# Patient Record
Sex: Male | Born: 1965 | Race: Asian | Hispanic: No | Marital: Married | Smoking: Former smoker
Health system: Southern US, Community
[De-identification: ages and names within clinical notes are randomized; demographics above are authoritative.]

## PROBLEM LIST (undated history)

## (undated) DIAGNOSIS — I10 Essential (primary) hypertension: Secondary | ICD-10-CM

## (undated) DIAGNOSIS — E785 Hyperlipidemia, unspecified: Secondary | ICD-10-CM

## (undated) HISTORY — DX: Hyperlipidemia, unspecified: E78.5

## (undated) HISTORY — DX: Essential (primary) hypertension: I10

---

## 1998-11-28 ENCOUNTER — Emergency Department (HOSPITAL_COMMUNITY): Admission: EM | Admit: 1998-11-28 | Discharge: 1998-11-28 | Payer: Self-pay

## 2012-08-11 ENCOUNTER — Ambulatory Visit (INDEPENDENT_AMBULATORY_CARE_PROVIDER_SITE_OTHER): Payer: Managed Care, Other (non HMO) | Admitting: Emergency Medicine

## 2012-08-11 VITALS — BP 143/95 | HR 84 | Temp 99.4°F | Resp 18 | Ht 69.75 in | Wt 175.0 lb

## 2012-08-11 DIAGNOSIS — J020 Streptococcal pharyngitis: Secondary | ICD-10-CM

## 2012-08-11 MED ORDER — AMOXICILLIN 875 MG PO TABS: 875.0000 mg | ORAL_TABLET | Freq: Two times a day (BID) | ORAL | Status: AC

## 2012-08-11 NOTE — Progress Notes (Signed)
  Date:  08/11/2012   Name:  Nathan Kelly   DOB:  11/21/1966   MRN:  [id number] Gender: male Age: 46 y.o.  PCP:  No primary provider on file.    Chief Complaint: Sore Throat and Fever   History of Present Illness:  Nathan Kelly is a 46 y.o. pleasant patient who presents with the following:  Sore throat and fever since last night.  Had chills last night. No coryza or cough, nausea or vomiting, stool change.  No improvement with tylenol so took advil and that improved how he was feeling.  There is no problem list on file for this patient.   No past medical history on file.  No past surgical history on file.  History  Substance Use Topics  . Smoking status: Former Games developer  . Smokeless tobacco: Not on file  . Alcohol Use: Not on file    No family history on file.  No Known Allergies  Medication list has been reviewed and updated.  No current outpatient prescriptions on file prior to visit.    Review of Systems:  As per HPI, otherwise negative.    Physical Examination: Filed Vitals:   08/11/12 1447  BP: 143/95  Pulse: 84  Temp: 99.4 F (37.4 C)  Resp: 18   Filed Vitals:   08/11/12 1447  Height: 5' 9.75" (1.772 m)  Weight: 175 lb (79.379 kg)   Body mass index is 25.29 kg/(m^2). Ideal Body Weight: Weight in (lb) to have BMI = 25: 172.6    GEN: WDWN, NAD, Non-toxic, Alert & Oriented x 3 HEENT: Atraumatic, Normocephalic.  Oropharynx red and injected.  No exudate Ears and Nose: No external deformity. TM negative NECK supple with anterior cervical nodes EXTR: No clubbing/cyanosis/edema NEURO: Normal gait.  PSYCH: Normally interactive. Conversant. Not depressed or anxious appearing.  Calm demeanor.    Assessment and Plan: Strep throat Amoxicillin advil Follow up as needed  Carmelina Dane, MD

## 2012-08-29 ENCOUNTER — Ambulatory Visit (INDEPENDENT_AMBULATORY_CARE_PROVIDER_SITE_OTHER): Payer: Managed Care, Other (non HMO) | Admitting: Emergency Medicine

## 2012-08-29 VITALS — BP 120/68 | HR 56 | Temp 98.0°F | Resp 16 | Ht 70.0 in | Wt 173.6 lb

## 2012-08-29 DIAGNOSIS — Z0000 Encounter for general adult medical examination without abnormal findings: Secondary | ICD-10-CM

## 2012-08-29 LAB — LIPID PANEL
Cholesterol: 176 mg/dL (ref 0–200)
LDL Cholesterol: 102 mg/dL — ABNORMAL HIGH (ref 0–99)
Triglycerides: 156 mg/dL — ABNORMAL HIGH (ref <150)
VLDL: 31 mg/dL (ref 0–40)

## 2012-08-29 LAB — COMPREHENSIVE METABOLIC PANEL
ALT: 34 U/L (ref 0–53)
Albumin: 4.3 g/dL (ref 3.5–5.2)
Alkaline Phosphatase: 77 U/L (ref 39–117)
CO2: 30 mEq/L (ref 19–32)
Glucose, Bld: 91 mg/dL (ref 70–99)
Potassium: 4 mEq/L (ref 3.5–5.3)
Sodium: 140 mEq/L (ref 135–145)
Total Protein: 7.5 g/dL (ref 6.0–8.3)

## 2012-08-29 LAB — POCT CBC
Granulocyte percent: 60.5 %G (ref 37–80)
MID (cbc): 0.4 (ref 0–0.9)
MPV: 9.8 fL (ref 0–99.8)
POC Granulocyte: 4 (ref 2–6.9)
POC MID %: 6.7 %M (ref 0–12)
Platelet Count, POC: 260 10*3/uL (ref 142–424)
RBC: 5.6 M/uL (ref 4.69–6.13)

## 2012-08-29 LAB — POCT UA - MICROSCOPIC ONLY: Yeast, UA: NEGATIVE

## 2012-08-29 LAB — POCT URINALYSIS DIPSTICK
Glucose, UA: NEGATIVE
Nitrite, UA: NEGATIVE
Protein, UA: NEGATIVE
Urobilinogen, UA: 0.2

## 2012-08-29 LAB — IFOBT (OCCULT BLOOD): IFOBT: POSITIVE

## 2012-08-29 NOTE — Progress Notes (Signed)
Date:  08/29/2012   Name:  Nathan Kelly   DOB:  [birth year]   MRN:  [id number] Gender: male Age: 46 y.o.  PCP:  No primary provider on file.    Chief Complaint: Annual Exam   History of Present Illness:  Nathan Kelly is a 46 y.o. pleasant patient who presents with the following:  For wellness exam.  Has no acute complaint.  History of high cholesterol treated with diet and exercise.  Nonsmoker, stopped 4 years ago.  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   No past medical history on file.  No past surgical history on file.  History  Substance Use Topics  . Smoking status: Former Games developer  . Smokeless tobacco: Not on file  . Alcohol Use: Not on file    No family history on file.  No Known Allergies  Medication list has been reviewed and updated.  No outpatient prescriptions prior to visit.    Review of Systems:  As per HPI, otherwise negative.    Physical Examination: Filed Vitals:   08/29/12 0922  BP: 120/68  Pulse: 56  Temp: 98 F (36.7 C)  Resp: 16   Filed Vitals:   08/29/12 0922  Height: 5\' 10"  (1.778 m)  Weight: 173 lb 9.6 oz (78.744 kg)   Body mass index is 24.91 kg/(m^2). Ideal Body Weight: Weight in (lb) to have BMI = 25: 173.9   GEN: WDWN, NAD, Non-toxic, A & O x 3 HEENT: Atraumatic, Normocephalic. Neck supple. No masses, No LAD.  Fundi benign.  Oropharynx negative Ears and Nose: No external deformity. TM negative CV: RRR, No M/G/R. No JVD. No thrill. No extra heart sounds. PULM: CTA B, no wheezes, crackles, rhonchi. No retractions. No resp. distress. No accessory muscle use. ABD: S, NT, ND, +BS. No rebound. No HSM. EXTR: No c/c/e NEURO Normal gait.  PSYCH: Normally interactive. Conversant. Not depressed or anxious appearing.  Calm demeanor.  Genitalia normal male circumcised Rectal normal exam.  Prostate right lobe seems firmer than left.    Assessment and Plan: Wellness exam History of high  cholesterol Labs Follow up as needed  Carmelina Dane, MD I have reviewed and agree with documentation. Robert P. Merla Riches, M.D.

## 2012-08-30 LAB — PSA: PSA: 2.7 ng/mL (ref <=4.00)

## 2012-08-30 LAB — VITAMIN D 25 HYDROXY (VIT D DEFICIENCY, FRACTURES): Vit D, 25-Hydroxy: 26 ng/mL — ABNORMAL LOW (ref 30–89)

## 2012-08-30 LAB — TESTOSTERONE: Testosterone: 427.9 ng/dL (ref 300–890)

## 2012-09-02 ENCOUNTER — Telehealth: Payer: Self-pay | Admitting: Radiology

## 2012-09-02 NOTE — Telephone Encounter (Signed)
Patient called for lab results, can someone review and I will call pt to advise?

## 2012-09-03 ENCOUNTER — Telehealth: Payer: Self-pay

## 2012-09-03 ENCOUNTER — Other Ambulatory Visit: Payer: Self-pay | Admitting: Emergency Medicine

## 2012-09-03 MED ORDER — VITAMIN D (ERGOCALCIFEROL) 1.25 MG (50000 UNIT) PO CAPS: 50000.0000 [IU] | ORAL_CAPSULE | ORAL | Status: DC

## 2012-09-03 NOTE — Telephone Encounter (Signed)
Pt notified. Faxed copy to pt.

## 2012-09-03 NOTE — Telephone Encounter (Signed)
Pt calling re: lab results  [phone]

## 2012-09-03 NOTE — Telephone Encounter (Signed)
Labs are normal except cholesterol slightly elevated. Work on [employer] and exercise. Vitamin D is slightly low - recommend OTC multivitamin.

## 2012-09-03 NOTE — Telephone Encounter (Signed)
Returning call re: lab results.  [phone]

## 2012-09-03 NOTE — Telephone Encounter (Signed)
The patient called again for lab results.  The patient is unhappy with having to wait for results.  Please call patient as soon as possible with results at [phone].

## 2012-09-04 NOTE — Telephone Encounter (Signed)
Called pt LMOM, it looks like we spoke to him already.

## 2013-12-22 ENCOUNTER — Ambulatory Visit (INDEPENDENT_AMBULATORY_CARE_PROVIDER_SITE_OTHER): Payer: No Typology Code available for payment source | Admitting: Family Medicine

## 2013-12-22 ENCOUNTER — Ambulatory Visit: Payer: No Typology Code available for payment source

## 2013-12-22 VITALS — BP 140/98 | HR 69 | Temp 98.0°F | Resp 16 | Ht 69.5 in | Wt 176.0 lb

## 2013-12-22 DIAGNOSIS — R1032 Left lower quadrant pain: Secondary | ICD-10-CM

## 2013-12-22 LAB — COMPREHENSIVE METABOLIC PANEL WITH GFR
ALT: 14 U/L (ref 0–53)
AST: 19 U/L (ref 0–37)
Albumin: 4.3 g/dL (ref 3.5–5.2)
CO2: 30 meq/L (ref 19–32)
Calcium: 8.9 mg/dL (ref 8.4–10.5)
Chloride: 102 meq/L (ref 96–112)
Creat: 0.83 mg/dL (ref 0.50–1.35)
Potassium: 3.9 meq/L (ref 3.5–5.3)

## 2013-12-22 LAB — POCT URINALYSIS DIPSTICK
Bilirubin, UA: NEGATIVE
Blood, UA: NEGATIVE
Glucose, UA: NEGATIVE
Ketones, UA: NEGATIVE
Leukocytes, UA: NEGATIVE
Nitrite, UA: NEGATIVE
Protein, UA: NEGATIVE
Spec Grav, UA: <=1.005
Urobilinogen, UA: 0.2
pH, UA: 6.5

## 2013-12-22 LAB — POCT CBC
Granulocyte percent: 66.1 % (ref 37–80)
HCT, POC: 46.9 % (ref 43.5–53.7)
Hemoglobin: 15.2 g/dL (ref 14.1–18.1)
Lymph, poc: 1.6 (ref 0.6–3.4)
MCH, POC: 28.9 pg (ref 27–31.2)
MCHC: 32.4 g/dL (ref 31.8–35.4)
MCV: 89.1 fL (ref 80–97)
MID (cbc): 0.3 (ref 0–0.9)
MPV: 7.6 fL (ref 0–99.8)
POC Granulocyte: 3.8 (ref 2–6.9)
POC LYMPH PERCENT: 27.9 %L (ref 10–50)
POC MID %: 6 % (ref 0–12)
Platelet Count, POC: 214 10*3/uL (ref 142–424)
RBC: 5.26 M/uL (ref 4.69–6.13)
RDW, POC: 13.3 %
WBC: 5.7 10*3/uL (ref 4.6–10.2)

## 2013-12-22 LAB — COMPREHENSIVE METABOLIC PANEL
Alkaline Phosphatase: 74 U/L (ref 39–117)
BUN: 12 mg/dL (ref 6–23)
Glucose, Bld: 100 mg/dL — ABNORMAL HIGH (ref 70–99)
Sodium: 139 mEq/L (ref 135–145)
Total Bilirubin: 0.8 mg/dL (ref 0.3–1.2)
Total Protein: 7.2 g/dL (ref 6.0–8.3)

## 2013-12-22 LAB — POCT UA - MICROSCOPIC ONLY
Bacteria, U Microscopic: NEGATIVE
Casts, Ur, LPF, POC: NEGATIVE
Crystals, Ur, HPF, POC: NEGATIVE
Epithelial cells, urine per micros: NEGATIVE
Mucus, UA: NEGATIVE
RBC, urine, microscopic: NEGATIVE
Yeast, UA: NEGATIVE

## 2013-12-22 NOTE — Patient Instructions (Signed)
Abdominal Pain, Adult  Many things can cause belly (abdominal) pain. Most times, the belly pain is not dangerous. Many cases of belly pain can be watched and treated at home.  HOME CARE   · Do not take medicines that help you go poop (laxatives) unless told to by your doctor.  · Only take medicine as told by your doctor.  · Eat or drink as told by your doctor. Your doctor will tell you if you should be on a special diet.  GET HELP IF:  · You do not know what is causing your belly pain.  · You have belly pain while you are sick to your stomach (nauseous) or have runny poop (diarrhea).  · You have pain while you pee or poop.  · Your belly pain wakes you up at night.  · You have belly pain that gets worse or better when you eat.  · You have belly pain that gets worse when you eat fatty foods.  GET HELP RIGHT AWAY IF:   · The pain does not go away within 2 hours.  · You have a fever.  · You keep throwing up (vomiting).  · The pain changes and is only in the right or left part of the belly.  · You have bloody or tarry looking poop.  MAKE SURE YOU:   · Understand these instructions.  · Will watch your condition.  · Will get help right away if you are not doing well or get worse.  Document Released: 05/15/2008 Document Revised: 09/17/2013 Document Reviewed: 08/06/2013  ExitCare® Patient Information ©2014 [employer].

## 2013-12-22 NOTE — Progress Notes (Signed)
Chief Complaint:  Chief Complaint  Patient presents with  . Hip Pain    Left side, little below hip, X 2 weeks    HPI: Nathan Kelly is a 48 y.o. male who is here for  LLQ abd pain about 2 weeks ago, may have lifted something wrong but not sure. He has not had fevers or chills, feels more pain after he eats. He has pain when bending over sometimes. He has had no abdominal surgeries. Denies nausea, vomiting.  He denies any pain or pressure when he lifts, has a BM, increase abd pressure or coughs. He does not think he has an inguinal hernia. BM have been normal. Denies constipation, He does lift his daughter who is 13 y/o.   History reviewed. No pertinent past medical history. History reviewed. No pertinent past surgical history. History   Social History  . Marital Status: Married    Spouse Name: N/A    Number of Children: N/A  . Years of Education: N/A   Social History Main Topics  . Smoking status: Former Games developer  . Smokeless tobacco: None  . Alcohol Use: None  . Drug Use: None  . Sexual Activity: None   Other Topics Concern  . None   Social History Narrative  . None   Family History  Problem Relation Age of Onset  . Diabetes Mother   . Hypertension Mother   . Hypertension Daughter    No Known Allergies Prior to Admission medications   Medication Sig Start Date End Date Taking? Authorizing Provider  Vitamin D, Ergocalciferol, (DRISDOL) 50000 UNITS CAPS Take 1 capsule (50,000 Units total) by mouth every 7 (seven) days. 09/03/12  Yes Phillips Odor, MD     ROS: The patient denies fevers, chills, night sweats, unintentional weight loss, chest pain, palpitations, wheezing, dyspnea on exertion, nausea, vomiting,  dysuria, hematuria, melena, numbness, weakness, or tingling.   All other systems have been reviewed and were otherwise negative with the exception of those mentioned in the HPI and as above.    PHYSICAL EXAM: Filed Vitals:   12/22/13 1019  BP: 140/98    Pulse:   Temp:   Resp:    Filed Vitals:   12/22/13 0936  Height: 5' 9.5" (1.765 m)  Weight: 176 lb (79.833 kg)   Body mass index is 25.63 kg/(m^2).  General: Alert, no acute distress HEENT:  Normocephalic, atraumatic, oropharynx patent. EOMI, PERRLA Cardiovascular:  Regular rate and rhythm, no rubs murmurs or gallops.  No Carotid bruits, radial pulse intact. No pedal edema.  Respiratory: Clear to auscultation bilaterally.  No wheezes, rales, or rhonchi.  No cyanosis, no use of accessory musculature GI: No organomegaly, abdomen is soft and non-tender, positive bowel sounds.  No masses. No ventral, abd hernias Skin: No rashes. Neurologic: Facial musculature symmetric. Psychiatric: Patient is appropriate throughout our interaction. Lymphatic: No cervical lymphadenopathy Musculoskeletal: Gait intact.  Back exam -nl, full ROM, 5/5 strength, nontender, no deformities   LABS: Results for orders placed in visit on 12/22/13  POCT CBC      Result Value Range   WBC 5.7  4.6 - 10.2 K/uL   Lymph, poc 1.6  0.6 - 3.4   POC LYMPH PERCENT 27.9  10 - 50 %L   MID (cbc) 0.3  0 - 0.9   POC MID % 6.0  0 - 12 %M   POC Granulocyte 3.8  2 - 6.9   Granulocyte percent 66.1  37 - 80 %G  RBC 5.26  4.69 - 6.13 M/uL   Hemoglobin 15.2  14.1 - 18.1 g/dL   HCT, POC 16.146.9  09.643.5 - 53.7 %   MCV 89.1  80 - 97 fL   MCH, POC 28.9  27 - 31.2 pg   MCHC 32.4  31.8 - 35.4 g/dL   RDW, POC 04.513.3     Platelet Count, POC 214  142 - 424 K/uL   MPV 7.6  0 - 99.8 fL  POCT UA - MICROSCOPIC ONLY      Result Value Range   WBC, Ur, HPF, POC 0-1     RBC, urine, microscopic neg     Bacteria, U Microscopic neg     Mucus, UA neg     Epithelial cells, urine per micros nege     Crystals, Ur, HPF, POC neg     Casts, Ur, LPF, POC neg     Yeast, UA neg    POCT URINALYSIS DIPSTICK      Result Value Range   Color, UA yellow     Clarity, UA clear     Glucose, UA neg     Bilirubin, UA neg     Ketones, UA neg     Spec  Grav, UA <=1.005     Blood, UA neg     pH, UA 6.5     Protein, UA neg     Urobilinogen, UA 0.2     Nitrite, UA neg     Leukocytes, UA Negative       EKG/XRAY:   Primary read [facility]. No free air, no obvious stones, nonspecific, nonobstructive gas patterns    ASSESSMENT/PLAN: Encounter Diagnosis  Name Primary?  . Abdominal pain, left lower quadrant Yes   Possible sprain or strain, ? Etiology . DOes not appear to be hernia in origin. POssible constipation vs sprain/strain from lifting daughter.  Try miralax  For 1 week and monitor for worsenign sxs F/u prn  Gross sideeffects, risk and benefits, and alternatives of medications d/w patient. Patient is aware that all medications have potential sideeffects and we are unable to predict every sideeffect or drug-drug interaction that may occur.  Shunna Mikaelian PHUONG, DO 12/22/2013 10:45 AM

## 2013-12-24 ENCOUNTER — Encounter: Payer: Self-pay | Admitting: Radiology

## 2014-04-07 ENCOUNTER — Ambulatory Visit (INDEPENDENT_AMBULATORY_CARE_PROVIDER_SITE_OTHER): Payer: BC Managed Care – PPO | Admitting: Family Medicine

## 2014-04-07 VITALS — BP 124/78 | HR 74 | Temp 97.4°F | Resp 16 | Ht 69.0 in | Wt 175.4 lb

## 2014-04-07 DIAGNOSIS — Z0000 Encounter for general adult medical examination without abnormal findings: Secondary | ICD-10-CM

## 2014-04-07 DIAGNOSIS — E559 Vitamin D deficiency, unspecified: Secondary | ICD-10-CM

## 2014-04-07 DIAGNOSIS — R195 Other fecal abnormalities: Secondary | ICD-10-CM

## 2014-04-07 DIAGNOSIS — Z23 Encounter for immunization: Secondary | ICD-10-CM

## 2014-04-07 LAB — POCT UA - MICROSCOPIC ONLY
Bacteria, U Microscopic: NEGATIVE
Casts, Ur, LPF, POC: NEGATIVE
Crystals, Ur, HPF, POC: NEGATIVE
Epithelial cells, urine per micros: NEGATIVE
Mucus, UA: NEGATIVE
WBC, Ur, HPF, POC: NEGATIVE
Yeast, UA: NEGATIVE

## 2014-04-07 LAB — POCT URINALYSIS DIPSTICK
Bilirubin, UA: NEGATIVE
Blood, UA: NEGATIVE
Glucose, UA: NEGATIVE
Ketones, UA: NEGATIVE
Leukocytes, UA: NEGATIVE
Nitrite, UA: NEGATIVE
PROTEIN UA: NEGATIVE
Spec Grav, UA: 1.01
UROBILINOGEN UA: 0.2
pH, UA: 6

## 2014-04-07 LAB — COMPREHENSIVE METABOLIC PANEL
ALT: 20 U/L (ref 0–53)
AST: 22 U/L (ref 0–37)
Albumin: 4.4 g/dL (ref 3.5–5.2)
Alkaline Phosphatase: 85 U/L (ref 39–117)
BUN: 10 mg/dL (ref 6–23)
CO2: 30 mEq/L (ref 19–32)
Calcium: 9.3 mg/dL (ref 8.4–10.5)
Chloride: 102 mEq/L (ref 96–112)
Creat: 0.81 mg/dL (ref 0.50–1.35)
Glucose, Bld: 89 mg/dL (ref 70–99)
Potassium: 3.8 mEq/L (ref 3.5–5.3)
Sodium: 138 mEq/L (ref 135–145)
Total Bilirubin: 0.9 mg/dL (ref 0.2–1.2)
Total Protein: 7.5 g/dL (ref 6.0–8.3)

## 2014-04-07 LAB — CBC WITH DIFFERENTIAL/PLATELET
Basophils Absolute: 0 10*3/uL (ref 0.0–0.1)
Basophils Relative: 0 % (ref 0–1)
EOS ABS: 0.2 10*3/uL (ref 0.0–0.7)
Eosinophils Relative: 2 % (ref 0–5)
HCT: 44.3 % (ref 39.0–52.0)
HEMOGLOBIN: 15.4 g/dL (ref 13.0–17.0)
LYMPHS ABS: 2.1 10*3/uL (ref 0.7–4.0)
Lymphocytes Relative: 28 % (ref 12–46)
MCH: 28.8 pg (ref 26.0–34.0)
MCHC: 34.8 g/dL (ref 30.0–36.0)
MCV: 82.8 fL (ref 78.0–100.0)
Monocytes Absolute: 0.6 10*3/uL (ref 0.1–1.0)
Monocytes Relative: 8 % (ref 3–12)
NEUTROS ABS: 4.7 10*3/uL (ref 1.7–7.7)
NEUTROS PCT: 62 % (ref 43–77)
PLATELETS: 215 10*3/uL (ref 150–400)
RBC: 5.35 MIL/uL (ref 4.22–5.81)
RDW: 13.6 % (ref 11.5–15.5)
WBC: 7.6 10*3/uL (ref 4.0–10.5)

## 2014-04-07 LAB — LIPID PANEL
Cholesterol: 194 mg/dL (ref 0–200)
HDL: 45 mg/dL (ref >39)
LDL Cholesterol: 102 mg/dL — ABNORMAL HIGH (ref 0–99)
Total CHOL/HDL Ratio: 4.3 Ratio
Triglycerides: 234 mg/dL — ABNORMAL HIGH (ref <150)
VLDL: 47 mg/dL — ABNORMAL HIGH (ref 0–40)

## 2014-04-07 MED ORDER — TETANUS-DIPHTH-ACELL PERTUSSIS 5-2.5-18.5 LF-MCG/0.5 IM SUSP: 0.5000 mL | Freq: Once | INTRAMUSCULAR | Status: DC

## 2014-04-07 NOTE — Progress Notes (Signed)
Subjective:    Patient ID: Nathan Kelly, male    DOB: [birth year], 48 y.o.   MRN: [id number]  HPI Patient reports today for CPE. He also would like to have a Tdap. He can not recall the last time he was vaccinated for tetanus. He would like to have his blood work checked today. Had low vitamin D level 9/13. Also noted positive IFOBT.   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 He runs for exercise 1-2 x day.   FH- married with 3 daugthers  Sleeps 6 hours a night. Feels tired some mornings. Tries to eat healthy.  Review of Systems  All other systems reviewed and are negative.    Objective:   Physical Exam  Vitals reviewed. Constitutional: He is oriented to person, place, and time. He appears well-developed and well-nourished.  HENT:  Head: Normocephalic and atraumatic.  Right Ear: Tympanic membrane, external ear and ear canal normal.  Left Ear: Tympanic membrane, external ear and ear canal normal.  Nose: Nose normal.  Mouth/Throat: Oropharynx is clear and moist. No oropharyngeal exudate.  Eyes: Conjunctivae are normal. Pupils are equal, round, and reactive to light. Right eye exhibits no discharge. Left eye exhibits no discharge.  Neck: Normal range of motion. Neck supple. No JVD present.  Cardiovascular: Normal rate, regular rhythm, normal heart sounds and intact distal pulses.   Pulmonary/Chest: Effort normal and breath sounds normal.  Abdominal: Soft. Bowel sounds are normal.  Genitourinary: Penis normal. No penile tenderness.  Patient did not want prostate exam today.  Musculoskeletal: Normal range of motion. He exhibits no edema and no tenderness.  Lymphadenopathy:    He has no cervical adenopathy.  Neurological: He is alert and oriented to person, place, and time. He has normal reflexes.  Skin: Skin is warm and dry.    Psychiatric: He has a normal mood and affect. His behavior is normal. Judgment and thought content normal.         Assessment & Plan:  1. Unspecified vitamin D deficiency - Vit D  25 hydroxy (rtn osteoporosis monitoring) - Was low 9/13 and had been taking daily Vit D  2. Heme positive stool -This was from 9/13. Patient to collect home IFOBT sample and return to clinic. If +, will refer to GI for colonoscopy. - CBC with Differential - Comprehensive metabolic panel - IFOBT POC (occult bld, rslt in office); Future   3. Annual physical exam - Lipid panel - POCT UA - Microscopic Only - POCT urinalysis dipstick  4. Need for immunization with diphtheria, tetanus, and poliovirus vaccine - Tdap (BOOSTRIX) injection 0.5 mL; Inject 0.5 mLs into the muscle once.  Encouraged patient to continue to exercise and eat healthy. Encouraged biannual dental visits and annual eye exams.   Emi Belfast, FNP-BC  [facility], [facility]  04/07/2014 1:36 PM

## 2014-04-07 NOTE — Progress Notes (Signed)
I have discussed this case with Ms. Gessner, NP and agree.  

## 2014-04-08 ENCOUNTER — Telehealth: Payer: Self-pay | Admitting: *Deleted

## 2014-04-08 ENCOUNTER — Other Ambulatory Visit: Payer: Self-pay | Admitting: Family Medicine

## 2014-04-08 DIAGNOSIS — R195 Other fecal abnormalities: Secondary | ICD-10-CM

## 2014-04-08 LAB — VITAMIN D 25 HYDROXY (VIT D DEFICIENCY, FRACTURES): Vit D, 25-Hydroxy: 47 ng/mL (ref 30–89)

## 2014-04-08 NOTE — Telephone Encounter (Signed)
Tried to call patient, one number disconnected, there was no answer at the other and no answering machine. Can you verify numbers?

## 2014-04-08 NOTE — Telephone Encounter (Signed)
Pt notified that labs have not been reviewed yet. Can you please review. Thanks

## 2014-04-08 NOTE — Telephone Encounter (Signed)
PT CALLED BACK ABOUT LABS.  HIS NUMBER IS [phone]

## 2014-04-08 NOTE — Telephone Encounter (Signed)
Pt is very anxious for his lab results. Can you review?

## 2014-04-09 NOTE — Telephone Encounter (Signed)
Labs were reviewed with patient on 04/08/14 at 4:28 pm by [place] Reeeborah Gessner.  She is referring him to GI.

## 2014-05-11 ENCOUNTER — Encounter: Payer: Self-pay | Admitting: Family Medicine

## 2014-09-09 ENCOUNTER — Ambulatory Visit (INDEPENDENT_AMBULATORY_CARE_PROVIDER_SITE_OTHER): Payer: BC Managed Care – PPO

## 2014-09-09 ENCOUNTER — Ambulatory Visit (INDEPENDENT_AMBULATORY_CARE_PROVIDER_SITE_OTHER): Payer: BC Managed Care – PPO | Admitting: Family Medicine

## 2014-09-09 VITALS — BP 134/72 | HR 60 | Temp 98.0°F | Resp 18 | Ht 70.0 in | Wt 168.0 lb

## 2014-09-09 DIAGNOSIS — R5381 Other malaise: Secondary | ICD-10-CM

## 2014-09-09 DIAGNOSIS — R5383 Other fatigue: Secondary | ICD-10-CM

## 2014-09-09 DIAGNOSIS — R531 Weakness: Secondary | ICD-10-CM

## 2014-09-09 DIAGNOSIS — R0602 Shortness of breath: Secondary | ICD-10-CM

## 2014-09-09 LAB — POCT CBC
Granulocyte percent: 63.9 %G (ref 37–80)
HCT, POC: 48.8 % (ref 43.5–53.7)
Hemoglobin: 16 g/dL (ref 14.1–18.1)
Lymph, poc: 1.5 (ref 0.6–3.4)
MCH: 28.6 pg (ref 27–31.2)
MCHC: 32.8 g/dL (ref 31.8–35.4)
MCV: 87.2 fL (ref 80–97)
MID (CBC): 0.4 (ref 0–0.9)
MPV: 7.2 fL (ref 0–99.8)
PLATELET COUNT, POC: 191 10*3/uL (ref 142–424)
POC Granulocyte: 3.3 (ref 2–6.9)
POC LYMPH %: 29 % (ref 10–50)
POC MID %: 7.1 % (ref 0–12)
RBC: 5.59 M/uL (ref 4.69–6.13)
RDW, POC: 13 %
WBC: 5.1 10*3/uL (ref 4.6–10.2)

## 2014-09-09 LAB — C-REACTIVE PROTEIN: CRP: <0.5 mg/dL (ref <0.60)

## 2014-09-09 LAB — IFOBT (OCCULT BLOOD): IFOBT: NEGATIVE

## 2014-09-09 LAB — CK: Total CK: 96 U/L (ref 7–232)

## 2014-09-09 LAB — POCT SEDIMENTATION RATE: POCT SED RATE: 11 mm/hr (ref 0–22)

## 2014-09-09 MED ORDER — ALPRAZOLAM 0.25 MG PO TBDP: 0.2500 mg | ORAL_TABLET | Freq: Three times a day (TID) | ORAL | Status: DC | PRN

## 2014-09-09 NOTE — Patient Instructions (Signed)
Panic Attacks Panic attacks are sudden, short-livedsurges of severe anxiety, fear, or discomfort. They may occur for no reason when you are relaxed, when you are anxious, or when you are sleeping. Panic attacks may occur for a number of reasons:   Healthy people occasionally have panic attacks in extreme, life-threatening situations, such as war or natural disasters. Normal anxiety is a protective mechanism of the body that helps us react to danger (fight or flight response).  Panic attacks are often seen with anxiety disorders, such as panic disorder, social anxiety disorder, generalized anxiety disorder, and phobias. Anxiety disorders cause excessive or uncontrollable anxiety. They may interfere with your relationships or other life activities.  Panic attacks are sometimes seen with other mental illnesses, such as depression and posttraumatic stress disorder.  Certain medical conditions, prescription medicines, and drugs of abuse can cause panic attacks. SYMPTOMS  Panic attacks start suddenly, peak within 20 minutes, and are accompanied by four or more of the following symptoms:  Pounding heart or fast heart rate (palpitations).  Sweating.  Trembling or shaking.  Shortness of breath or feeling smothered.  Feeling choked.  Chest pain or discomfort.  Nausea or strange feeling in your stomach.  Dizziness, light-headedness, or feeling like you will faint.  Chills or hot flushes.  Numbness or tingling in your lips or hands and feet.  Feeling that things are not real or feeling that you are not yourself.  Fear of losing control or going crazy.  Fear of dying. Some of these symptoms can mimic serious medical conditions. For example, you may think you are having a heart attack. Although panic attacks can be very scary, they are not life threatening. DIAGNOSIS  Panic attacks are diagnosed through an assessment by your health care provider. Your health care provider will ask  questions about your symptoms, such as where and when they occurred. Your health care provider will also ask about your medical history and use of alcohol and drugs, including prescription medicines. Your health care provider may order blood tests or other studies to rule out a serious medical condition. Your health care provider may refer you to a mental health professional for further evaluation. TREATMENT   Most healthy people who have one or two panic attacks in an extreme, life-threatening situation will not require treatment.  The treatment for panic attacks associated with anxiety disorders or other mental illness typically involves counseling with a mental health professional, medicine, or a combination of both. Your health care provider will help determine what treatment is best for you.  Panic attacks due to physical illness usually go away with treatment of the illness. If prescription medicine is causing panic attacks, talk with your health care provider about stopping the medicine, decreasing the dose, or substituting another medicine.  Panic attacks due to alcohol or drug abuse go away with abstinence. Some adults need professional help in order to stop drinking or using drugs. HOME CARE INSTRUCTIONS   Take all medicines as directed by your health care provider.   Schedule and attend follow-up visits as directed by your health care provider. It is important to keep all your appointments. SEEK MEDICAL CARE IF:  You are not able to take your medicines as prescribed.  Your symptoms do not improve or get worse. SEEK IMMEDIATE MEDICAL CARE IF:   You experience panic attack symptoms that are different than your usual symptoms.  You have serious thoughts about hurting yourself or others.  You are taking medicine for panic attacks and   have a serious side effect. MAKE SURE YOU:  Understand these instructions.  Will watch your condition.  Will get help right away if you are not  doing well or get worse. Document Released: 11/27/2005 Document Revised: 12/02/2013 Document Reviewed: 07/11/2013 ExitCare Patient Information 2015 [employer]. This information is not intended to replace advice given to you by your health care provider. Make sure you discuss any questions you have with your health care provider.  Generalized Anxiety Disorder Generalized anxiety disorder (GAD) is a mental disorder. It interferes with life functions, including relationships, work, and school. GAD is different from normal anxiety, which everyone experiences at some point in their lives in response to specific life events and activities. Normal anxiety actually helps us prepare for and get through these life events and activities. Normal anxiety goes away after the event or activity is over.  GAD causes anxiety that is not necessarily related to specific events or activities. It also causes excess anxiety in proportion to specific events or activities. The anxiety associated with GAD is also difficult to control. GAD can vary from mild to severe. People with severe GAD can have intense waves of anxiety with physical symptoms (panic attacks).  SYMPTOMS The anxiety and worry associated with GAD are difficult to control. This anxiety and worry are related to many life events and activities and also occur more days than not for 6 months or longer. People with GAD also have three or more of the following symptoms (one or more in children):  Restlessness.   Fatigue.  Difficulty concentrating.   Irritability.  Muscle tension.  Difficulty sleeping or unsatisfying sleep. DIAGNOSIS GAD is diagnosed through an assessment by your health care provider. Your health care provider will ask you questions aboutyour mood,physical symptoms, and events in your life. Your health care provider may ask you about your medical history and use of alcohol or drugs, including prescription medicines. Your health care  provider may also do a physical exam and blood tests. Certain medical conditions and the use of certain substances can cause symptoms similar to those associated with GAD. Your health care provider may refer you to a mental health specialist for further evaluation. TREATMENT The following therapies are usually used to treat GAD:   Medication. Antidepressant medication usually is prescribed for long-term daily control. Antianxiety medicines may be added in severe cases, especially when panic attacks occur.   Talk therapy (psychotherapy). Certain types of talk therapy can be helpful in treating GAD by providing support, education, and guidance. A form of talk therapy called cognitive behavioral therapy can teach you healthy ways to think about and react to daily life events and activities.  Stress managementtechniques. These include yoga, meditation, and exercise and can be very helpful when they are practiced regularly. A mental health specialist can help determine which treatment is best for you. Some people see improvement with one therapy. However, other people require a combination of therapies. Document Released: 03/24/2013 Document Revised: 04/13/2014 Document Reviewed: 03/24/2013 ExitCare Patient Information 2015 [employer]. This information is not intended to replace advice given to you by your health care provider. Make sure you discuss any questions you have with your health care provider.  

## 2014-09-09 NOTE — Progress Notes (Addendum)
Subjective:  This chart was scribed for Norberto Sorenson, MD by Charline Bills, ED Scribe. The patient was seen in room 3. Patient's care was started at 9:12 AM.   Patient ID: Nathan Kelly, male    DOB: Apr 21, 1966, 48 y.o.   MRN: [id number]  Chief Complaint  Patient presents with  . Shortness of Breath    started this morning at 4am   . Generalized Body Aches    all over x2 weeks more like weakness    HPI HPI Comments: Nathan Kelly is a 48 y.o. male who presents to the [facility] complaining of SOB onset 5 hours ago. Pt states that he usually sleeps with a door open but shut it last night. Pt states that SOB lasted for 10 minutes. Pt reports associated generalized arthralgias over the past 2 weeks, worse last night and joint weakness. Pt reports intermittent weakness specifically in shoulders, elbows, knees, ankles, hips, toes, fingers that last for 15-30 minutes. Pt is still able to perform normal activities during weakness. Pt states that he checked his BP at home with a reading of 140/80. He denies pain with lifting. He also denies wheezing, cough, chest pain, palpitations, leg swelling, congestion, dizziness, light-headedness, appetite change. No recent car trips or flights. No h/o blood clot.   Pt was seen in 2013 for blood in stool. He scheduled an appointment with a GI specialist but missed the appointment.   Pt quit smoking 5 years ago. He smoked .5 ppd since middle school.   He has been very stressed lately.  Is building a new house.  Sxs feel like anxiety or like when you wake from a nightmare and your heart is racing.  Past Medical History  Diagnosis Date  . Hyperlipidemia    Current Outpatient Prescriptions on File Prior to Visit  Medication Sig Dispense Refill  . Vitamin D, Ergocalciferol, (DRISDOL) 50000 UNITS CAPS Take 1 capsule (50,000 Units total) by mouth every 7 (seven) days.  12 capsule  3   Current Facility-Administered Medications on File Prior to Visit    Medication Dose Route Frequency Provider Last Rate Last Dose  . Tdap (BOOSTRIX) injection 0.5 mL  0.5 mL Intramuscular Once Emi Belfast, FNP       No Known Allergies  Review of Systems  Constitutional: Negative for appetite change.  HENT: Negative for congestion.   Respiratory: Positive for shortness of breath. Negative for cough and wheezing.   Cardiovascular: Negative for chest pain, palpitations and leg swelling.  Musculoskeletal: Positive for arthralgias.  Neurological: Positive for weakness. Negative for dizziness and light-headedness.   Triage Vitals: BP 134/72  Pulse 60  Temp(Src) 98 F (36.7 C) (Oral)  Resp 18  Ht 5\' 10"  (1.778 m)  Wt 168 lb (76.204 kg)  BMI 24.11 kg/m2  SpO2 100%    Objective:   Physical Exam  Nursing note and vitals reviewed. Constitutional: He is oriented to person, place, and time. He appears well-developed and well-nourished.  HENT:  Head: Normocephalic and atraumatic.  Right Ear: Tympanic membrane is retracted.  Left Ear: Tympanic membrane is retracted.  Mouth/Throat: Oropharynx is clear and moist and mucous membranes are normal. No oropharyngeal exudate, posterior oropharyngeal edema or posterior oropharyngeal erythema.  Eyes: Conjunctivae and EOM are normal.  Neck: Neck supple. No mass and no thyromegaly present.  Cardiovascular: Normal rate, regular rhythm and normal heart sounds.   No murmur heard. Pulses:      Radial pulses are 2+ on the  right side, and 2+ on the left side.  Pulmonary/Chest: Effort normal and breath sounds normal.  Genitourinary: Rectum normal and prostate normal. Rectal exam shows no mass.  Normal prostate Normal anal vault No blood   Musculoskeletal: Normal range of motion.  Lymphadenopathy:    He has no cervical adenopathy.       Right: No supraclavicular adenopathy present.       Left: No supraclavicular adenopathy present.  Neurological: He is alert and oriented to person, place, and time.  Skin: Skin  is warm and dry.  Psychiatric: He has a normal mood and affect. His behavior is normal.  Predicted Peak Flow: 630; Actual Peak Flow: 800 EKG: normal sinus rhythm without ischemic changes   [facility] reading (PRIMARY) by  Dr. Clelia Croft.  CXR: no acute abnormality Orthostatics neg  EXAM: CHEST 2 VIEW  COMPARISON: None.  FINDINGS: Lateral view degraded by patient arm position. Moderate lower thoracic spondylosis. Midline trachea. Normal heart size and mediastinal contours. No pleural effusion or pneumothorax. A nodular density projects over the left mid lung on the frontal radiograph. 9 mm. Clear right lung.  IMPRESSION: Nodular density projecting over the left mid lung on the frontal radiograph. This could represent a nipple shadow. Repeat frontal radiograph with nipple markers could confirm. These results will be called to the ordering clinician or representative by the Radiologist Assistant, and communication documented in the PACS or zVision Dashboard.     Assessment & Plan:   Shortness of breath - Plan: C-reactive protein, CK, Vit D  25 hydroxy (rtn osteoporosis monitoring), POCT CBC, POCT SEDIMENTATION RATE, IFOBT POC (occult bld, rslt in office), DG Chest 2 View, EKG 12-Lead, Peak flow meter  Weakness - Plan: C-reactive protein, CK, Vit D  25 hydroxy (rtn osteoporosis monitoring), POCT CBC, POCT SEDIMENTATION RATE, IFOBT POC (occult bld, rslt in office), DG Chest 2 View, EKG 12-Lead Pt suspects that his sxs may be caused by stress/anxiety - we will do a therapeutic trial.  If it does help and he continues to have sxs, rec RTC to discuss various ways to treat anxiety. He will cont to monitor BP outside the office w/ home cuff - bring to f/u to check against ours for accuracy Meds ordered this encounter  Medications  . OVER THE COUNTER MEDICATION    Sig: bioflex qd  . ALPRAZolam (NIRAVAM) 0.25 MG dissolvable tablet    Sig: Take 1 tablet (0.25 mg total) by mouth 3 (three) times  daily as needed for anxiety (and panic, shortness of breath or weakness).    Dispense:  30 tablet    Refill:  0    I personally performed the services described in this documentation, which was scribed in my presence. The recorded information has been reviewed and considered, and addended by me as needed.  Norberto Sorenson, MD MPH

## 2014-09-10 LAB — VITAMIN D 25 HYDROXY (VIT D DEFICIENCY, FRACTURES): VIT D 25 HYDROXY: 43 ng/mL (ref 30–89)

## 2014-09-14 ENCOUNTER — Telehealth: Payer: Self-pay | Admitting: *Deleted

## 2014-09-14 NOTE — Telephone Encounter (Signed)
Can you please review labs from 09/09/14

## 2014-09-14 NOTE — Telephone Encounter (Signed)
Labs normal but chest xray likely had an artifact but should be repeated - please have pt come in for repeat CXR. Note sent to MyChart w/ labs.

## 2014-09-14 NOTE — Telephone Encounter (Signed)
Spoke to pt- transferred to schedule an appt. Pt requested an appt.

## 2014-09-15 ENCOUNTER — Telehealth: Payer: Self-pay

## 2014-09-15 NOTE — Telephone Encounter (Signed)
Patient called regarding lab results. Please return call. CB # [phone]

## 2014-09-15 NOTE — Telephone Encounter (Signed)
See labs. Let pt know what Dr. Clelia CroftShaw says.

## 2014-09-21 ENCOUNTER — Ambulatory Visit: Payer: BC Managed Care – PPO | Admitting: Family Medicine

## 2014-09-28 ENCOUNTER — Ambulatory Visit: Payer: BC Managed Care – PPO | Admitting: Family Medicine

## 2014-09-29 ENCOUNTER — Ambulatory Visit: Payer: BC Managed Care – PPO | Admitting: Family Medicine

## 2015-07-26 ENCOUNTER — Ambulatory Visit (INDEPENDENT_AMBULATORY_CARE_PROVIDER_SITE_OTHER): Payer: 59 | Admitting: Family Medicine

## 2015-07-26 VITALS — BP 116/72 | HR 68 | Temp 97.9°F | Resp 18 | Ht 70.0 in | Wt 177.0 lb

## 2015-07-26 DIAGNOSIS — M7540 Impingement syndrome of unspecified shoulder: Secondary | ICD-10-CM

## 2015-07-26 DIAGNOSIS — M75100 Unspecified rotator cuff tear or rupture of unspecified shoulder, not specified as traumatic: Secondary | ICD-10-CM

## 2015-07-26 DIAGNOSIS — M25519 Pain in unspecified shoulder: Secondary | ICD-10-CM

## 2015-07-26 DIAGNOSIS — Z130 Encounter for screening for diseases of the blood and blood-forming organs and certain disorders involving the immune mechanism: Secondary | ICD-10-CM | POA: Diagnosis not present

## 2015-07-26 DIAGNOSIS — E559 Vitamin D deficiency, unspecified: Secondary | ICD-10-CM | POA: Diagnosis not present

## 2015-07-26 DIAGNOSIS — Z13220 Encounter for screening for lipoid disorders: Secondary | ICD-10-CM | POA: Diagnosis not present

## 2015-07-26 LAB — COMPLETE METABOLIC PANEL WITH GFR
ALT: 18 U/L (ref 9–46)
CO2: 28 mmol/L (ref 20–31)
Chloride: 100 mmol/L (ref 98–110)
GFR, Est African American: >89 mL/min (ref >=60)
GFR, Est Non African American: >89 mL/min (ref >=60)
Glucose, Bld: 97 mg/dL (ref 65–99)
Sodium: 140 mmol/L (ref 135–146)

## 2015-07-26 LAB — COMPLETE METABOLIC PANEL WITHOUT GFR
AST: 22 U/L (ref 10–40)
Albumin: 4.2 g/dL (ref 3.6–5.1)
Alkaline Phosphatase: 76 U/L (ref 40–115)
BUN: 12 mg/dL (ref 7–25)
Calcium: 9 mg/dL (ref 8.6–10.3)
Creat: 0.86 mg/dL (ref 0.60–1.35)
Potassium: 4.4 mmol/L (ref 3.5–5.3)
Total Bilirubin: 0.8 mg/dL (ref 0.2–1.2)
Total Protein: 7.7 g/dL (ref 6.1–8.1)

## 2015-07-26 LAB — CBC
HCT: 47.5 % (ref 39.0–52.0)
Hemoglobin: 16.1 g/dL (ref 13.0–17.0)
MCH: 28.4 pg (ref 26.0–34.0)
MCHC: 33.9 g/dL (ref 30.0–36.0)
MCV: 83.8 fL (ref 78.0–100.0)
MPV: 9.5 fL (ref 8.6–12.4)
Platelets: 212 10*3/uL (ref 150–400)
RBC: 5.67 MIL/uL (ref 4.22–5.81)
RDW: 13.7 % (ref 11.5–15.5)
WBC: 7.3 10*3/uL (ref 4.0–10.5)

## 2015-07-26 LAB — LIPID PANEL
Cholesterol: 184 mg/dL (ref 125–200)
HDL: 46 mg/dL (ref >=40)
LDL Cholesterol: 109 mg/dL (ref <130)
Total CHOL/HDL Ratio: 4 ratio (ref <=5.0)
Triglycerides: 147 mg/dL (ref <150)
VLDL: 29 mg/dL (ref <30)

## 2015-07-26 NOTE — Progress Notes (Signed)
 Chief Complaint:  Chief Complaint  Patient presents with  . Shoulder Pain    Both, onset 2 weeks  . Lipid panel    HPI: Nathan Kelly is a 49 y.o. male who reports to [facility] today complaining of bilateral 2/10 constant and sometimes sharp  achng painin shoudlers, worse when he sleeps on it, NKI, he denies any w/n/t. He has to stretch and shake it off. He has not tried anything for this. 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   Wants labs for hyperlipdiemia, did not eat today.  He is doing well otherwise.   Past Medical History  Diagnosis Date  . Hyperlipidemia    History reviewed. No pertinent past surgical history. Social History   Social History  . Marital Status: Married    Spouse Name: N/A  . Number of Children: N/A  . Years of Education: N/A   Social History Main Topics  . Smoking status: Former Games developer  . Smokeless tobacco: None  . Alcohol Use: None  . Drug Use: None  . Sexual Activity: Not Asked   Other Topics Concern  . None   Social History Narrative   Family History  Problem Relation Age of Onset  . Diabetes Mother   . Hypertension Mother   . Hypertension Daughter    No Known Allergies Prior to Admission medications   Medication Sig Start Date End Date Taking? Authorizing Provider  ALPRAZolam (NIRAVAM) 0.25 MG dissolvable tablet Take 1 tablet (0.25 mg total) by mouth 3 (three) times daily as needed for anxiety (and panic, shortness of breath or weakness). Patient not taking: Reported on 07/26/2015 09/09/14   Sherren Mocha, MD  OVER THE COUNTER MEDICATION bioflex qd    Historical Provider, MD  Vitamin D, Ergocalciferol, (DRISDOL) 50000 UNITS CAPS Take 1 capsule (50,000 Units total) by mouth every 7 (seven) days. Patient not taking: Reported on 07/26/2015 09/03/12   Carmelina Dane, MD     ROS: The patient denies fevers, chills, night sweats, unintentional weight loss, chest pain, palpitations,  wheezing, dyspnea on exertion, nausea, vomiting, abdominal pain, dysuria, hematuria, melena, numbness, weakness, or tingling.   All other systems have been reviewed and were otherwise negative with the exception of those mentioned in the HPI and as above.    PHYSICAL EXAM: Filed Vitals:   07/26/15 1040  BP: 116/72  Pulse: 68  Temp: 97.9 F (36.6 C)  Resp: 18   Body mass index is 25.4 kg/(m^2).   General: Alert, no acute distress HEENT:  Normocephalic, atraumatic, oropharynx patent. EOMI, PERRLA Cardiovascular:  Regular rate and rhythm, no rubs murmurs or gallops.  No Carotid bruits, radial pulse intact. No pedal edema.  Respiratory: Clear to auscultation bilaterally.  No wheezes, rales, or rhonchi.  No cyanosis, no use of accessory musculature Abdominal: No organomegaly, abdomen is soft and non-tender, positive bowel sounds. No masses. Skin: No rashes. Neurologic: Facial musculature symmetric. Psychiatric: Patient acts appropriately throughout our interaction. Lymphatic: No cervical or submandibular lymphadenopathy Musculoskeletal: Gait intact. No edema, tenderness Neck exam normal-neg spurling Shoulder No deformity, no hypertrophy/atrophy, no erythema, no fluid, no wounds Full ROM Nontender at [facility] jt Neg Empty Can test, neg Lift off test, neg Speeds, Neg Hawkins/Neers 5/5 strength, 2/2 triceps and biceps DTRs       LABS: Results for orders placed or performed in visit on 09/09/14  C-reactive protein  Result Value Ref Range  CRP <0.5 <0.60 mg/dL  CK  Result Value Ref Range   Total CK 96 7 - 232 U/L  Vit D  25 hydroxy (rtn osteoporosis monitoring)  Result Value Ref Range   Vit D, 25-Hydroxy 43 30 - 89 ng/mL  POCT CBC  Result Value Ref Range   WBC 5.1 4.6 - 10.2 K/uL   Lymph, poc 1.5 0.6 - 3.4   POC LYMPH PERCENT 29.0 10 - 50 %L   MID (cbc) 0.4 0 - 0.9   POC MID % 7.1 0 - 12 %M   POC Granulocyte 3.3 2 - 6.9   Granulocyte percent 63.9 37 - 80 %G   RBC 5.59 4.69  - 6.13 M/uL   Hemoglobin 16.0 14.1 - 18.1 g/dL   HCT, POC 16.1 09.6 - 53.7 %   MCV 87.2 80 - 97 fL   MCH, POC 28.6 27 - 31.2 pg   MCHC 32.8 31.8 - 35.4 g/dL   RDW, POC 04.5 %   Platelet Count, POC 191 142 - 424 K/uL   MPV 7.2 0 - 99.8 fL  POCT SEDIMENTATION RATE  Result Value Ref Range   POCT SED RATE 11 0 - 22 mm/hr  IFOBT POC (occult bld, rslt in office)  Result Value Ref Range   IFOBT Negative      EKG/XRAY:   Primary read interpreted by Dr. Conley Rolls at [facility].   ASSESSMENT/PLAN: Encounter Diagnoses  Name Primary?  . Pain in joint, shoulder region, unspecified laterality Yes  . Screening for deficiency anemia   . Screening for hyperlipidemia   . Vitamin D deficiency   . Rotator cuff impingement syndrome, unspecified laterality    Shoulder exercises otc tylenol/ibuprofen prn IF no improvement send to PT Declines any other meds which is fine considering he has releif with ROM exercises Labs for Vit D def and hyperlipemia pending Fu prn    Gross sideeffects, risk and benefits, and alternatives of medications d/w patient. Patient is aware that all medications have potential sideeffects and we are unable to predict every sideeffect or drug-drug interaction that may occur.    DO  07/26/2015 12:27 PM

## 2015-07-26 NOTE — Patient Instructions (Signed)

## 2015-07-27 LAB — VITAMIN D 25 HYDROXY (VIT D DEFICIENCY, FRACTURES): Vit D, 25-Hydroxy: 49 ng/mL (ref 30–100)

## 2015-07-27 LAB — TSH: TSH: 0.628 u[IU]/mL (ref 0.350–4.500)

## 2015-09-25 IMAGING — CR DG ABDOMEN 1V
1 series · 1 of 1 positions shown · non-contrast
Comparison: None.

CLINICAL DATA: Left lower quadrant abdominal pain

EXAM:
ABDOMEN - 1 VIEW

[AP]
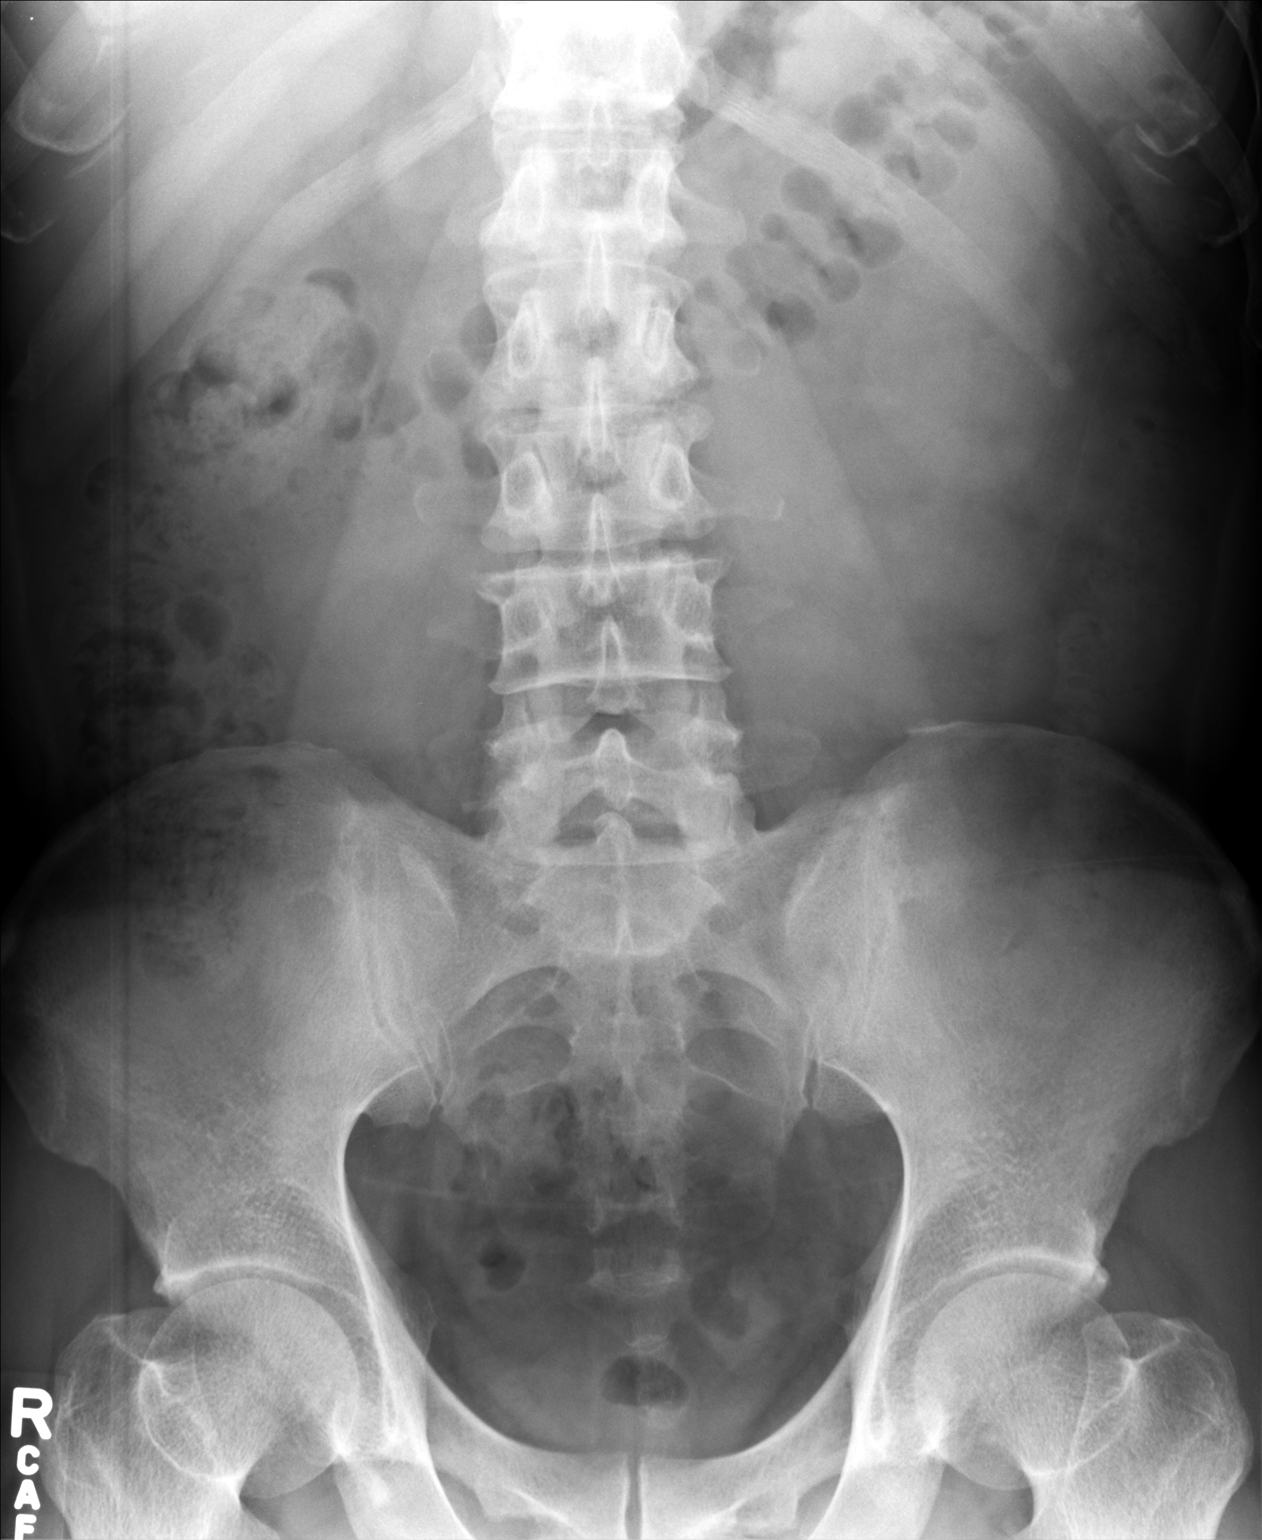

[1 of 1 positions shown; findings below may reference images not displayed]

FINDINGS: The bowel gas pattern is normal. No radio-opaque calculi or other
significant radiographic abnormality are seen.
IMPRESSION: Negative.

## 2015-12-22 ENCOUNTER — Telehealth: Payer: Self-pay

## 2015-12-22 DIAGNOSIS — R109 Unspecified abdominal pain: Secondary | ICD-10-CM

## 2015-12-22 NOTE — Telephone Encounter (Signed)
I went ahead and referred.

## 2015-12-22 NOTE — Telephone Encounter (Signed)
Can we refer without OV?

## 2015-12-22 NOTE — Telephone Encounter (Signed)
Patient requests a referral to a gastroenterologist.  He had a referral in 2015 for  GI, but he never scheduled with them due to fears of having a colonoscopy.  He said he would like to go and discuss other methods of diagnosis/treatment.  His previous referral was listed for "Heme positive stool."  A new office visit may be needed if this diagnosis is to be utilized again.  He said his abdominal pains are located "at his waistline" and they "come and go" throughout the day.  Please advise, thank you.  CB#: [phone]

## 2015-12-24 ENCOUNTER — Encounter: Payer: Self-pay | Admitting: Gastroenterology

## 2015-12-29 ENCOUNTER — Encounter: Payer: Self-pay | Admitting: Gastroenterology

## 2015-12-29 ENCOUNTER — Ambulatory Visit (INDEPENDENT_AMBULATORY_CARE_PROVIDER_SITE_OTHER): Payer: BLUE CROSS/BLUE SHIELD | Admitting: Gastroenterology

## 2015-12-29 VITALS — BP 124/80 | HR 76 | Ht 69.29 in | Wt 181.1 lb

## 2015-12-29 DIAGNOSIS — R1032 Left lower quadrant pain: Secondary | ICD-10-CM | POA: Insufficient documentation

## 2015-12-29 DIAGNOSIS — Z1211 Encounter for screening for malignant neoplasm of colon: Secondary | ICD-10-CM

## 2015-12-29 NOTE — Patient Instructions (Signed)
We have sent your demographic and insurance information to [facility]. They should contact you within the next week regarding your Cologuard (colon cancer screening) test. If you have not heard from them within the next week, please call our office at [phone]. 

## 2015-12-29 NOTE — Progress Notes (Signed)
Reviewed and agree with management plan.  Malcolm T. Stark, MD FACG 

## 2015-12-29 NOTE — Progress Notes (Signed)
12/29/2015 Nathan Kelly [id number] 01/16/1966   HISTORY OF PRESENT ILLNESS:  This is a 50 year old Asian male who is new to our practice. He is here today to discuss colonoscopy as well as complaints of left lower quadrant abdominal pain. He tells me that he moves his bowels well and does not ever seeing any blood in his stool. He has been experiencing intermittent left lower quadrant abdominal pain a couple of times per week for approximately the past year. The pain only occurs at nighttime and once he moves to different position the pain resolves, therefore, only lasting 5 or 10 minutes at a time.  He does say that seems to occur on nights when he tends to eat very late. Recent CBC, CMP, TSH were within normal limits within the past 6 months.  He would like to try to get screening for colon cancer, but would prefer Cologuard rather than colonoscopy at this point; he is willing to have colonoscopy if Cologuard is positive.   Past Medical History  Diagnosis Date  . Hyperlipidemia    History reviewed. No pertinent past surgical history.  reports that he has quit smoking. He has never used smokeless tobacco. He reports that he does not drink alcohol or use illicit drugs. family history includes Diabetes in his mother; Hypertension in his daughter and mother. No Known Allergies    Outpatient Encounter Prescriptions as of 12/29/2015  Medication Sig  . OVER THE COUNTER MEDICATION bioflex qd   Facility-Administered Encounter Medications as of 12/29/2015  Medication  . Tdap (BOOSTRIX) injection 0.5 mL     REVIEW OF SYSTEMS  : All other systems reviewed and negative except where noted in the History of Present Illness.   PHYSICAL EXAM: BP 124/80 mmHg  Pulse 76  Ht 5' 9.29" (1.76 m)  Wt 181 lb 2 oz (82.158 kg)  BMI 26.52 kg/m2 General: Well developed Asian male in no acute distress Head: Normocephalic and atraumatic Eyes:  Sclerae anicteric, conjunctiva pink. Ears: Normal auditory  acuity Lungs: Clear throughout to auscultation Heart: Regular rate and rhythm Abdomen: Soft, non-distended.  Normal bowel sounds.  Minimal LLQ/left groin TTP without R/R/G. Musculoskeletal: Symmetrical with no gross deformities  Skin: No lesions on visible extremities Extremities: No edema  Neurological: Alert oriented x 4, grossly non-focal Psychological:  Alert and cooperative. Normal mood and affect  ASSESSMENT AND PLAN: -Screening for colon malignancy:  Patient sent here to discuss colonoscopy.  Patient declines colonoscopy, but would like to have a Cologuard if possible. -LLQ abdominal pain:  Intermittent, comes a couple of times per week and only lasts several minutes before resolving.  ? Musculoskeletal or inguinal hernia.  Does not necessarily sound GI in origin and has no other associated GI complaints.  If pain persists could consider CT scan.  CC:  Dorna Leitz, PA-C

## 2016-01-03 ENCOUNTER — Telehealth: Payer: Self-pay | Admitting: Gastroenterology

## 2016-01-03 NOTE — Telephone Encounter (Signed)
Left message with ICD codes on patients voicemail.

## 2016-05-17 ENCOUNTER — Telehealth: Payer: Self-pay | Admitting: Gastroenterology

## 2016-05-17 NOTE — Telephone Encounter (Signed)
Left a message for patient to call back. Located results of Cologuard which are negative.

## 2016-05-18 NOTE — Telephone Encounter (Signed)
Left a message for patient to call back. 

## 2016-05-19 ENCOUNTER — Other Ambulatory Visit: Payer: Self-pay

## 2016-05-19 LAB — COLOGUARD: COLOGUARD: NEGATIVE

## 2016-05-19 NOTE — Telephone Encounter (Signed)
Left a message for patient to call back. 

## 2016-05-22 NOTE — Telephone Encounter (Signed)
Patient given result(negative) and fax a copy of result to patient at [phone]. Do you want a 3 year recall for patient?

## 2016-05-23 NOTE — Telephone Encounter (Signed)
Yes please

## 2016-05-23 NOTE — Telephone Encounter (Signed)
Recall in EPIC 

## 2016-06-12 IMAGING — CR DG CHEST 2V
2 series · 2 of 2 positions shown · non-contrast
Comparison: None.

CLINICAL DATA: Short of breath and weakness.

EXAM:
CHEST  2 VIEW

[PA]
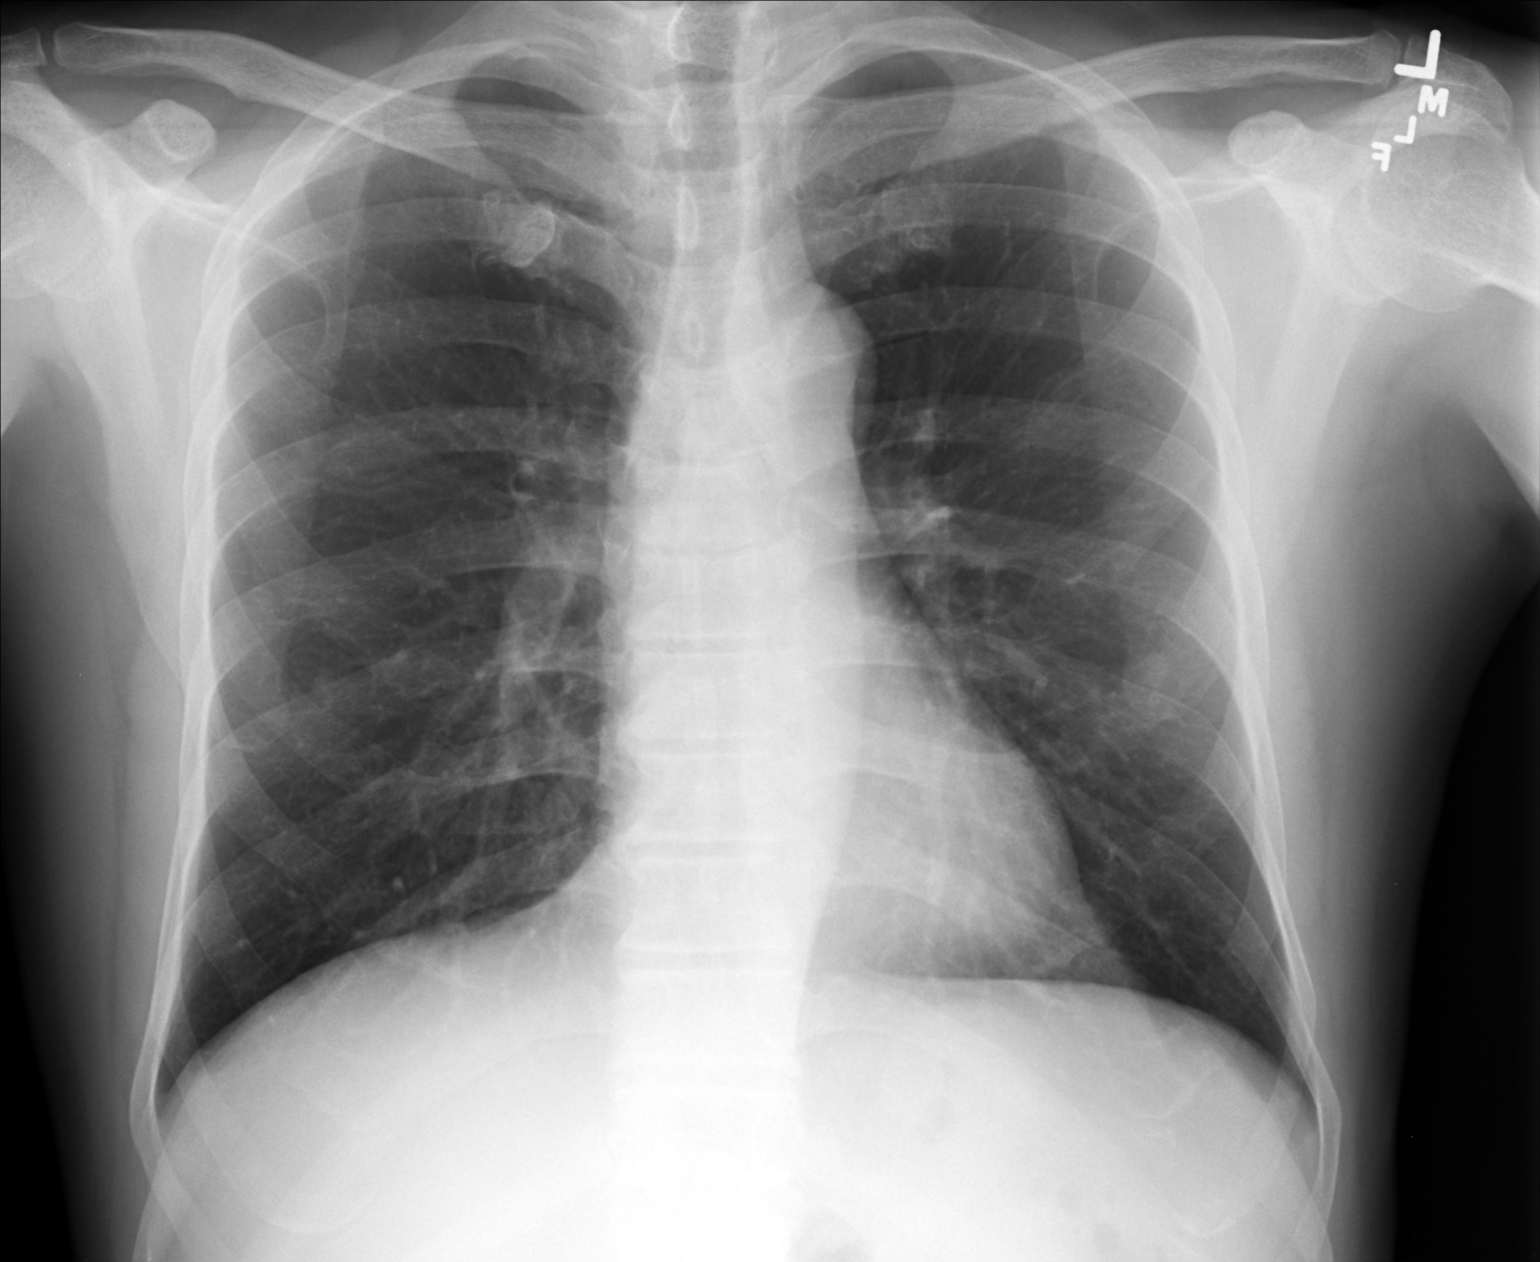

[lateral]
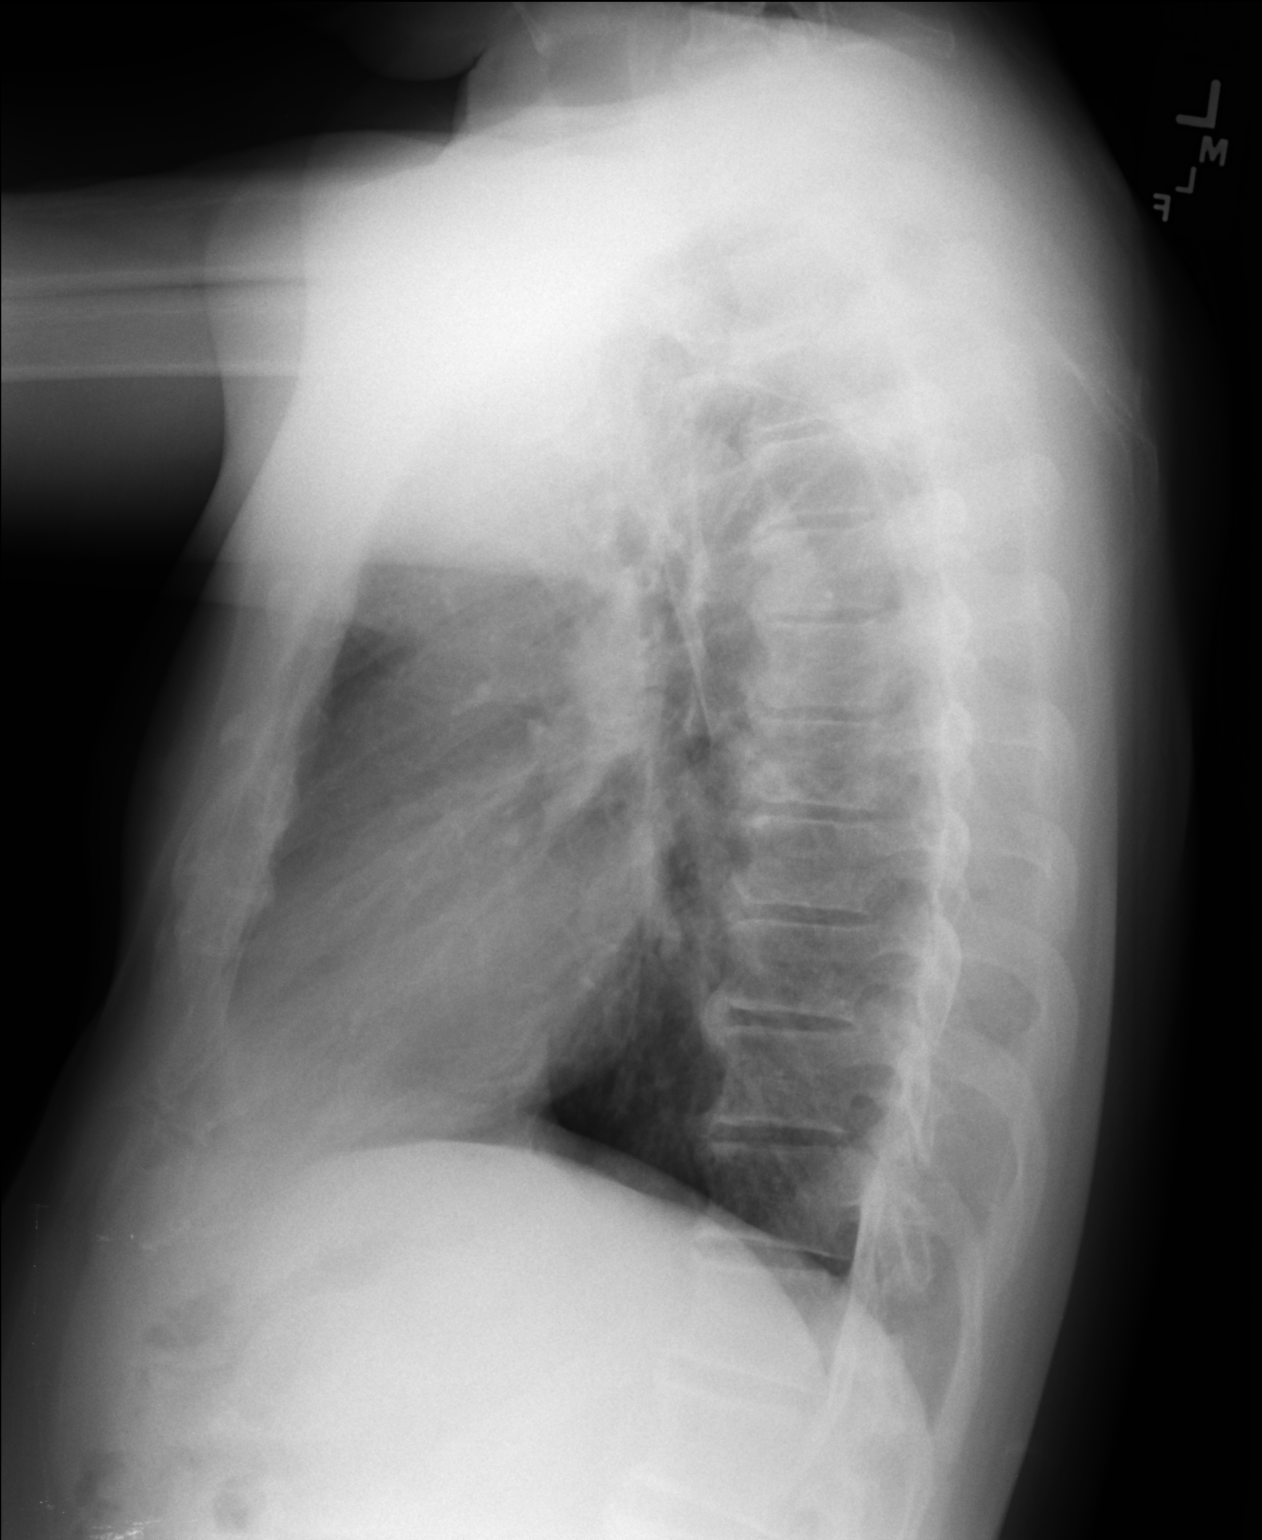

[2 of 2 positions shown; findings below may reference images not displayed]

FINDINGS: Lateral view degraded by patient arm position. Moderate lower
thoracic spondylosis. Midline trachea. Normal heart size and
mediastinal contours. No pleural effusion or pneumothorax. A nodular
density projects over the left mid lung on the frontal radiograph. 9
mm. Clear right lung.
IMPRESSION: Nodular density projecting over the left mid lung on the frontal
radiograph. This could represent a nipple shadow. Repeat frontal
radiograph with nipple markers could confirm. These results will be
called to the ordering clinician or representative by the
Radiologist Assistant, and communication documented in the PACS or
zVision Dashboard.

## 2017-02-28 ENCOUNTER — Ambulatory Visit (INDEPENDENT_AMBULATORY_CARE_PROVIDER_SITE_OTHER): Payer: BLUE CROSS/BLUE SHIELD | Admitting: Physician Assistant

## 2017-02-28 VITALS — BP 120/80 | HR 71 | Temp 98.0°F | Resp 17 | Ht 69.29 in | Wt 179.0 lb

## 2017-02-28 DIAGNOSIS — L298 Other pruritus: Secondary | ICD-10-CM | POA: Diagnosis not present

## 2017-02-28 DIAGNOSIS — Z23 Encounter for immunization: Secondary | ICD-10-CM | POA: Diagnosis not present

## 2017-02-28 MED ORDER — ZOSTER VAC RECOMB ADJUVANTED 50 MCG/0.5ML IM SUSR: 50.0000 ug | Freq: Once | INTRAMUSCULAR | 0 refills | Status: AC

## 2017-02-28 MED ORDER — PREDNISONE 20 MG PO TABS: ORAL_TABLET | ORAL | 0 refills | Status: AC

## 2017-02-28 NOTE — Patient Instructions (Signed)
     IF you received an x-ray today, you will receive an invoice from [facility]. Please contact  Radiology at [phone] with questions or concerns regarding your invoice.   IF you received labwork today, you will receive an invoice from [facility]. Please contact [facility] at [phone] with questions or concerns regarding your invoice.   Our billing staff will not be able to assist you with questions regarding bills from these companies.  You will be contacted with the lab results as soon as they are available. The fastest way to get your results is to activate your My Chart account. Instructions are located on the last page of this paperwork. If you have not heard from us regarding the results in 2 weeks, please contact this office.     

## 2017-02-28 NOTE — Progress Notes (Signed)
02/28/2017 2:57 PM   DOB: May 25, 1966 / MRN: [id number]  SUBJECTIVE:  Nathan Kelly is a 51 y.o.  male with no significant previous medical history presenting for rash that started after eating fish about 5 days ago. He complains of severe itching, which occurs mostly at night.  Tells me he was seen at a local medical office and was given a shot of prednisone and the rash immediately went away. He is sexually with his wife 20 years and he is monogamous.    He has No Known Allergies.   He  has a past medical history of Hyperlipidemia.    He  reports that he has quit smoking. He has never used smokeless tobacco. He reports that he does not drink alcohol or use drugs. He  has no sexual activity history on file. The patient  has no past surgical history on file.  His family history includes Diabetes in his mother; Hypertension in his daughter and mother.  Review of Systems  Constitutional: Negative for chills and fever.  Gastrointestinal: Negative for nausea.  Skin: Positive for itching and rash.  Neurological: Negative for dizziness.    The problem list and medications were reviewed and updated by myself where necessary and exist elsewhere in the encounter.   OBJECTIVE:  BP 120/80 (BP Location: Right Arm, Patient Position: Sitting, Cuff Size: Normal)   Pulse 71   Temp 98 F (36.7 C) (Oral)   Resp 17   Ht 5' 9.29" (1.76 m)   Wt 179 lb (81.2 kg)   SpO2 100%   BMI 26.21 kg/m   Physical Exam  Constitutional: He is oriented to person, place, and time.  HENT:  Mouth/Throat: Oropharynx is clear and moist. No oropharyngeal exudate.  Cardiovascular: Normal rate and regular rhythm.   Pulmonary/Chest: Effort normal.  Neurological: He is alert and oriented to person, place, and time.           No results found for this or any previous visit (from the past 72 hour(s)).  No results found.  ASSESSMENT AND PLAN:  [place] was seen today for rash.  Diagnoses and all orders for this  visit:  Pruritic erythematous rash: He was better on pred near the start of the rash.  He may not have had enough time to clear the antigen.  Will try a longer course of pred.  If this fails he will come back and will rule out HIV and start a rheum work up.  -     CMP14+EGFR -     predniSONE (DELTASONE) 20 MG tablet; Take 3 in the morning for 3 days, then 2 in the morning for 3 days, and then 1 in the morning for 3 days.  Need for shingles vaccine: He will take this 2 weeks after stopping pred.  -     Varicella-zoster vaccine IM (Shingrix)    The patient is advised to call or return to clinic if he does not see an improvement in symptoms, or to seek the care of the closest emergency department if he worsens with the above plan.   Deliah Boston, MHS, PA-C [facility] 02/28/2017 2:57 PM

## 2017-03-01 LAB — CMP14+EGFR
ALBUMIN: 4.3 g/dL (ref 3.5–5.5)
ALK PHOS: 83 IU/L (ref 39–117)
ALT: 15 IU/L (ref 0–44)
AST: 19 IU/L (ref 0–40)
Albumin/Globulin Ratio: 1.4 (ref 1.2–2.2)
BUN / CREAT RATIO: 16 (ref 9–20)
BUN: 14 mg/dL (ref 6–24)
Bilirubin Total: 0.5 mg/dL (ref 0.0–1.2)
CO2: 27 mmol/L (ref 18–29)
CREATININE: 0.9 mg/dL (ref 0.76–1.27)
Calcium: 8.9 mg/dL (ref 8.7–10.2)
Chloride: 98 mmol/L (ref 96–106)
GFR calc Af Amer: 115 mL/min/{1.73_m2} (ref >59)
GFR calc non Af Amer: 99 mL/min/{1.73_m2} (ref >59)
GLUCOSE: 91 mg/dL (ref 65–99)
Globulin, Total: 3.1 g/dL (ref 1.5–4.5)
Potassium: 3.6 mmol/L (ref 3.5–5.2)
Sodium: 141 mmol/L (ref 134–144)
Total Protein: 7.4 g/dL (ref 6.0–8.5)

## 2017-03-02 ENCOUNTER — Telehealth: Payer: Self-pay | Admitting: Family Medicine

## 2017-03-02 NOTE — Telephone Encounter (Signed)
Normal labs.

## 2017-03-02 NOTE — Telephone Encounter (Signed)
Pt calling about labs °

## 2017-03-02 NOTE — Telephone Encounter (Signed)
Correct

## 2017-03-05 ENCOUNTER — Telehealth: Payer: Self-pay | Admitting: General Practice

## 2017-03-05 ENCOUNTER — Telehealth: Payer: Self-pay | Admitting: Family Medicine

## 2017-03-05 NOTE — Telephone Encounter (Signed)
[phone] faxed

## 2017-03-05 NOTE — Telephone Encounter (Signed)
Pt is calling to ask Deliah BostonMichael Clark which dermatologist do he suggest his rash is no better and still itch he wants to make his own referral please call pt

## 2017-03-05 NOTE — Telephone Encounter (Signed)
Pt is looking for lab results from Thursday   Best number [phone]

## 2017-03-05 NOTE — Telephone Encounter (Signed)
Yes. Labs perfect thus far.

## 2017-03-05 NOTE — Telephone Encounter (Signed)
Normal result given 

## 2017-03-05 NOTE — Telephone Encounter (Signed)
I want to see him back in the clinic to see how the rash has progressed.   There is more work to do to ensure that this is actually a skin problem and not from something else.  If he insist on a referral then I would suggest [facility]. Deliah BostonMichael Broghan Pannone, MS, PA-C 9:24 PM, 03/05/2017

## 2017-03-07 ENCOUNTER — Ambulatory Visit (INDEPENDENT_AMBULATORY_CARE_PROVIDER_SITE_OTHER): Payer: BLUE CROSS/BLUE SHIELD | Admitting: Physician Assistant

## 2017-03-07 VITALS — BP 147/90 | HR 58 | Temp 97.6°F | Resp 16 | Ht 69.29 in | Wt 177.8 lb

## 2017-03-07 DIAGNOSIS — Z114 Encounter for screening for human immunodeficiency virus [HIV]: Secondary | ICD-10-CM | POA: Diagnosis not present

## 2017-03-07 DIAGNOSIS — Z1329 Encounter for screening for other suspected endocrine disorder: Secondary | ICD-10-CM | POA: Diagnosis not present

## 2017-03-07 DIAGNOSIS — Z13220 Encounter for screening for lipoid disorders: Secondary | ICD-10-CM

## 2017-03-07 DIAGNOSIS — R21 Rash and other nonspecific skin eruption: Secondary | ICD-10-CM

## 2017-03-07 DIAGNOSIS — Z130 Encounter for screening for diseases of the blood and blood-forming organs and certain disorders involving the immune mechanism: Secondary | ICD-10-CM

## 2017-03-07 DIAGNOSIS — Z0000 Encounter for general adult medical examination without abnormal findings: Secondary | ICD-10-CM

## 2017-03-07 DIAGNOSIS — Z113 Encounter for screening for infections with a predominantly sexual mode of transmission: Secondary | ICD-10-CM

## 2017-03-07 MED ORDER — TRIAMCINOLONE ACETONIDE 0.5 % EX CREA: 1.0000 "application " | TOPICAL_CREAM | Freq: Two times a day (BID) | CUTANEOUS | 0 refills | Status: DC

## 2017-03-07 NOTE — Patient Instructions (Addendum)
We did lab work and gave you a cream for your rash.    IF you received an x-ray today, you will receive an invoice from [facility]. Please contact [facility] at [phone] with questions or concerns regarding your invoice.   IF you received labwork today, you will receive an invoice from [facility]. Please contact [facility] at [phone] with questions or concerns regarding your invoice.   Our billing staff will not be able to assist you with questions regarding bills from these companies.  You will be contacted with the lab results as soon as they are available. The fastest way to get your results is to activate your My Chart account. Instructions are located on the last page of this paperwork. If you have not heard from us regarding the results in 2 weeks, please contact this office.    Keeping you healthy  Get these tests  Blood pressure- Have your blood pressure checked once a year by your healthcare provider.  Normal blood pressure is 120/80  Weight- Have your body mass index (BMI) calculated to screen for obesity.  BMI is a measure of body fat based on height and weight. You can also calculate your own BMI at [email or web address]  Cholesterol- Have your cholesterol checked every year.  Diabetes- Have your blood sugar checked regularly if you have high blood pressure, high cholesterol, have a family history of diabetes or if you are overweight.  Screening for Colon Cancer- Colonoscopy starting at age 51.  Screening may begin sooner depending on your family history and other health conditions. Follow up colonoscopy as directed by your Gastroenterologist.  Screening for Prostate Cancer- Both blood work (PSA) and a rectal exam help screen for Prostate Cancer.  Screening begins at age 51 with African-American men and at age 51 with Caucasian men.  Screening may begin sooner depending on your family history.  Take these medicines  Aspirin- One aspirin daily  can help prevent Heart disease and Stroke.  Flu shot- Every fall.  Tetanus- Every 10 years.  Zostavax- Once after the age of 51 to prevent Shingles.  Pneumonia shot- Once after the age of 51; if you are younger than 3765, ask your healthcare provider if you need a Pneumonia shot.  Take these steps  Don't smoke- If you do smoke, talk to your doctor about quitting.  For tips on how to quit, go to www.smokefree.gov or call 1-800-QUIT-NOW.  Be physically active- Exercise 5 days a week for at least 30 minutes.  If you are not already physically active start slow and gradually work up to 30 minutes of moderate physical activity.  Examples of moderate activity include walking briskly, mowing the yard, dancing, swimming, bicycling, etc.  Eat a healthy diet- Eat a variety of healthy food such as fruits, vegetables, low fat milk, low fat cheese, yogurt, lean meant, poultry, fish, beans, tofu, etc. For more information go to www.thenutritionsource.org  Drink alcohol in moderation- Limit alcohol intake to less than two drinks a day. Never drink and drive.  Dentist- Brush and floss twice daily; visit your dentist twice a year.  Depression- Your emotional health is as important as your physical health. If you're feeling down, or losing interest in things you would normally enjoy please talk to your healthcare provider.  Eye exam- Visit your eye doctor every year.  Safe sex- If you may be exposed to a sexually transmitted infection, use a condom.  Seat belts- Seat belts can save your life; always wear one.  Smoke/Carbon  Monoxide detectors- These detectors need to be installed on the appropriate level of your home.  Replace batteries at least once a year.  Skin cancer- When out in the sun, cover up and use sunscreen 15 SPF or higher.  Violence- If anyone is threatening you, please tell your healthcare provider.  Living Will/ Health care power of attorney- Speak with your healthcare provider and  family.

## 2017-03-07 NOTE — Progress Notes (Signed)
Nathan Kelly  MRN: [id number] DOB: 07-13-1966  PCP: No PCP Per Patient  Subjective:  Pt presents to clinic for a CPE.  Rash - for about 23 days - he has had an injection of steroid and then oral steroids but the rash is clearing up but still very itchy and waking him up at night.  He has also taken atarax which makes him sleepy and zyrtec but neither helped with the itching.  He thinks that this started after he ate fish.  Wife does not have any type of rash.  He is worried because she was treated for an STD at the [facility] with a pill but he is not sure what it was but he was never treated.  Now he is concerned and would like full STD screening.   Last dental exam: every 6 months Last vision exam: 3 months ago  Last colonoscopy: cologuard - neg 2017 Vaccinations      Tetanus - UTD  Typical meals for patient: 3 meals no snacks Typical beverage choices: green tea and water, starbucks (1-2/week) Exercises: 3 times per week for 30 minutes Sleeps: up all night 5-6 hrs per night  Patient Active Problem List   Diagnosis Date Noted  . Special screening for malignant neoplasms, colon 12/29/2015  . LLQ abdominal pain 12/29/2015    Review of Systems  Constitutional: Negative.   HENT: Negative.   Eyes: Negative.   Respiratory: Negative.   Cardiovascular: Negative.   Gastrointestinal: Negative.   Endocrine: Negative.   Genitourinary: Negative.   Musculoskeletal: Negative.   Skin: Positive for rash.  Allergic/Immunologic: Negative.   Neurological: Negative.   Hematological: Negative.   Psychiatric/Behavioral: Negative.      Current Outpatient Prescriptions on File Prior to Visit  Medication Sig Dispense Refill  . predniSONE (DELTASONE) 20 MG tablet Take 3 in the morning for 3 days, then 2 in the morning for 3 days, and then 1 in the morning for 3 days. 18 tablet 0   No current facility-administered medications on file prior to visit.     No Known Allergies  Social History    Social History  . Marital status: Married    Spouse name: [name]  . Number of children: 3  . Years of education: N/A   Occupational History  . Technical brewer   Social History Main Topics  . Smoking status: Former Smoker    Packs/day: 0.50    Years: 30.00    Quit date: 12/11/2008  . Smokeless tobacco: Never Used  . Alcohol use No  . Drug use: No  . Sexual activity: Yes    Partners: Female    Birth control/ protection: Surgical   Other Topics Concern  . None   Social History Narrative   Moved to [place] in 1989 from [place]   Lives with wife and 3 daughters      Wears seatbelt 100%   Guns in home - yes secured    No past surgical history on file.  Family History  Problem Relation Age of Onset  . Diabetes Mother   . Hypertension Mother   . Hypertension Daughter      Objective:  BP (!) 147/90   Pulse (!) 58   Temp 97.6 F (36.4 C) (Oral)   Resp 16   Ht 5' 9.29" (1.76 m)   Wt 177 lb 12.8 oz (80.6 kg)   SpO2 100%   BMI 26.04 kg/m   Physical Exam  Constitutional: He is oriented to person, place, and time and well-developed, well-nourished, and in no distress.  HENT:  Head: Normocephalic and atraumatic.  Right Ear: Hearing, tympanic membrane, external ear and ear canal normal.  Left Ear: Hearing, tympanic membrane, external ear and ear canal normal.  Nose: Nose normal.  Mouth/Throat: Uvula is midline, oropharynx is clear and moist and mucous membranes are normal.  Eyes: Conjunctivae and EOM are normal. Pupils are equal, round, and reactive to light.  Neck: Trachea normal and normal range of motion. Neck supple. No thyroid mass and no thyromegaly present.  Cardiovascular: Normal rate, regular rhythm and normal heart sounds.   No murmur heard. Pulmonary/Chest: Effort normal and breath sounds normal.  Abdominal: Soft. Bowel sounds are normal. Hernia confirmed negative in the right inguinal area and confirmed negative in the left inguinal  area.  Genitourinary: Testes/scrotum normal and penis normal.  Musculoskeletal: Normal range of motion.  Neurological: He is alert and oriented to person, place, and time. Gait normal.  Skin: Skin is warm and dry.  Rash appears to be improved -- still present on arms and legs and worse on the ankles - mild erythema but mostly hyperpigmentation and excoriations -- no harold patch seen on torso  Psychiatric: Mood, memory, affect and judgment normal.    Visual Acuity Screening   Right eye Left eye Both eyes  Without correction: 20/20 20/20 -1 20/15 -1  With correction:       Assessment and Plan :  Annual physical exam  Screening for deficiency anemia - Plan: CBC with Differential/Platelet  Screening, lipid - Plan: Lipid panel  Rash - Plan: RPR, Dermatology pathology, triamcinolone cream (KENALOG) 0.5 % -- unsure cause - check additional labs including RPR - ? Possible pityriasis rosea due to length of time and extra itchy without any help with IM or oral steroids.  Will give the patient topical at his request while we are waiting on his biopsy results  Screening for HIV (human immunodeficiency virus) - Plan: HIV antibody  Screen for STD (sexually transmitted disease) - Plan: GC/Chlamydia Probe Amp, Trichomonas vaginalis, RNA  Screening for thyroid disorder - Plan: TSH   Benny Lennert PA-C  Primary Care at [facility] 03/07/2017 1:48 PM

## 2017-03-08 LAB — CBC WITH DIFFERENTIAL/PLATELET
BASOS ABS: 0 10*3/uL (ref 0.0–0.2)
BASOS: 0 %
EOS (ABSOLUTE): 0.1 10*3/uL (ref 0.0–0.4)
Eos: 1 %
Hematocrit: 44.7 % (ref 37.5–51.0)
Hemoglobin: 15.3 g/dL (ref 13.0–17.7)
Immature Grans (Abs): 0 10*3/uL (ref 0.0–0.1)
Immature Granulocytes: 1 %
LYMPHS ABS: 2 10*3/uL (ref 0.7–3.1)
Lymphs: 24 %
MCH: 28.2 pg (ref 26.6–33.0)
MCHC: 34.2 g/dL (ref 31.5–35.7)
MCV: 83 fL (ref 79–97)
MONOS ABS: 0.7 10*3/uL (ref 0.1–0.9)
Monocytes: 8 %
NEUTROS ABS: 5.7 10*3/uL (ref 1.4–7.0)
Neutrophils: 66 %
Platelets: 234 10*3/uL (ref 150–379)
RBC: 5.42 x10E6/uL (ref 4.14–5.80)
RDW: 14.1 % (ref 12.3–15.4)
WBC: 8.5 10*3/uL (ref 3.4–10.8)

## 2017-03-08 LAB — GC/CHLAMYDIA PROBE AMP
CHLAMYDIA, DNA PROBE: NEGATIVE
NEISSERIA GONORRHOEAE BY PCR: NEGATIVE

## 2017-03-08 LAB — LIPID PANEL
Chol/HDL Ratio: 3 ratio units (ref 0.0–5.0)
Cholesterol, Total: 204 mg/dL — ABNORMAL HIGH (ref 100–199)
HDL: 69 mg/dL (ref >39)
LDL Calculated: 99 mg/dL (ref 0–99)
Triglycerides: 181 mg/dL — ABNORMAL HIGH (ref 0–149)
VLDL CHOLESTEROL CAL: 36 mg/dL (ref 5–40)

## 2017-03-08 LAB — TSH: TSH: 1.3 u[IU]/mL (ref 0.450–4.500)

## 2017-03-08 LAB — RPR: RPR: NONREACTIVE

## 2017-03-08 LAB — HIV ANTIBODY (ROUTINE TESTING W REFLEX): HIV SCREEN 4TH GENERATION: NONREACTIVE

## 2017-03-08 NOTE — Telephone Encounter (Signed)
l/m with clarks note  

## 2017-03-09 ENCOUNTER — Telehealth: Payer: Self-pay | Admitting: Physician Assistant

## 2017-03-09 LAB — TRICHOMONAS VAGINALIS, PROBE AMP: Trich vag by NAA: NEGATIVE

## 2017-03-09 NOTE — Telephone Encounter (Signed)
PATIENT HAD HIS ANNUAL PHYSICAL DONE WITH SARAH ON Wednesday (03/07/17). HE IS CALLING TO GET THE LAB RESULTS. BEST PHONE [phone] (CELL) PHARMACY CHOICE IS [facility] ON [address] MBC

## 2017-03-09 NOTE — Telephone Encounter (Signed)
All labs look nl except lipid and pathology not back?

## 2018-03-12 ENCOUNTER — Ambulatory Visit (INDEPENDENT_AMBULATORY_CARE_PROVIDER_SITE_OTHER): Payer: BLUE CROSS/BLUE SHIELD | Admitting: Family Medicine

## 2018-03-12 ENCOUNTER — Encounter: Payer: Self-pay | Admitting: Family Medicine

## 2018-03-12 ENCOUNTER — Other Ambulatory Visit: Payer: Self-pay

## 2018-03-12 VITALS — BP 128/82 | HR 76 | Temp 97.8°F | Ht 69.0 in | Wt 176.0 lb

## 2018-03-12 DIAGNOSIS — Z0000 Encounter for general adult medical examination without abnormal findings: Secondary | ICD-10-CM | POA: Diagnosis not present

## 2018-03-12 DIAGNOSIS — Z130 Encounter for screening for diseases of the blood and blood-forming organs and certain disorders involving the immune mechanism: Secondary | ICD-10-CM | POA: Diagnosis not present

## 2018-03-12 DIAGNOSIS — E785 Hyperlipidemia, unspecified: Secondary | ICD-10-CM | POA: Diagnosis not present

## 2018-03-12 DIAGNOSIS — Z125 Encounter for screening for malignant neoplasm of prostate: Secondary | ICD-10-CM

## 2018-03-12 DIAGNOSIS — Z1329 Encounter for screening for other suspected endocrine disorder: Secondary | ICD-10-CM

## 2018-03-12 NOTE — Patient Instructions (Addendum)
   IF you received an x-ray today, you will receive an invoice from [facility]. Please contact [facility] at [phone] with questions or concerns regarding your invoice.   IF you received labwork today, you will receive an invoice from [facility]. Please contact [facility] at [phone] with questions or concerns regarding your invoice.   Our billing staff will not be able to assist you with questions regarding bills from these companies.  You will be contacted with the lab results as soon as they are available. The fastest way to get your results is to activate your My Chart account. Instructions are located on the last page of this paperwork. If you have not heard from us regarding the results in 2 weeks, please contact this office.     Preventive Care 40-64 Years, Male Preventive care refers to lifestyle choices and visits with your health care provider that can promote health and wellness. What does preventive care include?  A yearly physical exam. This is also called an annual well check.  Dental exams once or twice a year.  Routine eye exams. Ask your health care provider how often you should have your eyes checked.  Personal lifestyle choices, including: ? Daily care of your teeth and gums. ? Regular physical activity. ? Eating a healthy diet. ? Avoiding tobacco and drug use. ? Limiting alcohol use. ? Practicing safe sex. ? Taking low-dose aspirin every day starting at age 50. What happens during an annual well check? The services and screenings done by your health care provider during your annual well check will depend on your age, overall health, lifestyle risk factors, and family history of disease. Counseling Your health care provider may ask you questions about your:  Alcohol use.  Tobacco use.  Drug use.  Emotional well-being.  Home and relationship well-being.  Sexual activity.  Eating habits.  Work and work  environment.  Screening You may have the following tests or measurements:  Height, weight, and BMI.  Blood pressure.  Lipid and cholesterol levels. These may be checked every 5 years, or more frequently if you are over 50 years old.  Skin check.  Lung cancer screening. You may have this screening every year starting at age 55 if you have a 30-pack-year history of smoking and currently smoke or have quit within the past 15 years.  Fecal occult blood test (FOBT) of the stool. You may have this test every year starting at age 50.  Flexible sigmoidoscopy or colonoscopy. You may have a sigmoidoscopy every 5 years or a colonoscopy every 10 years starting at age 50.  Prostate cancer screening. Recommendations will vary depending on your family history and other risks.  Hepatitis C blood test.  Hepatitis B blood test.  Sexually transmitted disease (STD) testing.  Diabetes screening. This is done by checking your blood sugar (glucose) after you have not eaten for a while (fasting). You may have this done every 1-3 years.  Discuss your test results, treatment options, and if necessary, the need for more tests with your health care provider. Vaccines Your health care provider may recommend certain vaccines, such as:  Influenza vaccine. This is recommended every year.  Tetanus, diphtheria, and acellular pertussis (Tdap, Td) vaccine. You may need a Td booster every 10 years.  Varicella vaccine. You may need this if you have not been vaccinated.  Zoster vaccine. You may need this after age 60.  Measles, mumps, and rubella (MMR) vaccine. You may need at least one dose of   MMR if you were born in 1957 or later. You may also need a second dose.  Pneumococcal 13-valent conjugate (PCV13) vaccine. You may need this if you have certain conditions and have not been vaccinated.  Pneumococcal polysaccharide (PPSV23) vaccine. You may need one or two doses if you smoke cigarettes or if you have  certain conditions.  Meningococcal vaccine. You may need this if you have certain conditions.  Hepatitis A vaccine. You may need this if you have certain conditions or if you travel or work in places where you may be exposed to hepatitis A.  Hepatitis B vaccine. You may need this if you have certain conditions or if you travel or work in places where you may be exposed to hepatitis B.  Haemophilus influenzae type b (Hib) vaccine. You may need this if you have certain risk factors.  Talk to your health care provider about which screenings and vaccines you need and how often you need them. This information is not intended to replace advice given to you by your health care provider. Make sure you discuss any questions you have with your health care provider. Document Released: 12/24/2015 Document Revised: 08/16/2016 Document Reviewed: 09/28/2015 Elsevier Interactive Patient Education  2018 Elsevier Inc.  

## 2018-03-12 NOTE — Progress Notes (Signed)
4/2/20199:39 AM  Stark Jock 12/20/1965, 52 y.o. male [id number]  Chief Complaint  Patient presents with  . Annual Exam    wanting lipid panel done, concern of cholestorol. Not wanting groin area checked today    HPI:   Patient is a 52 y.o. male with past medical history significant for dyslipidemia who presents today for CPE  Last CPE 02/2017 Colorectal Cancer Screening: cologuard 2017, negative Prostate Cancer Screening: checking today HIV Screening: 2018 Seasonal Influenza Vaccination: due next season Td/Tdap Vaccination: 2015 Pneumococcal Vaccination: n/a Zoster Vaccination: h/o childhood chicken pox, will wait until his 21s Frequency of Dental evaluation: Q6 months Frequency of Eye evaluation: no concerns Diet with good variety or fruits and vegetables Exercises 2-3 times a week, mostly weight lifting  Depression screen [facility] 2/9 03/12/2018 03/07/2017  Decreased Interest 0 0  Down, Depressed, Hopeless 0 0  PHQ - 2 Score 0 0    No Known Allergies  Prior to Admission medications   Medication Sig Start Date End Date Taking? Authorizing Provider    Past Medical History:  Diagnosis Date  . Hyperlipidemia     History reviewed. No pertinent surgical history.  Social History   Tobacco Use  . Smoking status: Former Smoker    Packs/day: 0.50    Years: 30.00    Pack years: 15.00    Last attempt to quit: 12/11/2008    Years since quitting: 9.2  . Smokeless tobacco: Never Used  Substance Use Topics  . Alcohol use: No    Alcohol/week: 0.0 oz    Family History  Problem Relation Age of Onset  . Diabetes Mother   . Hypertension Mother   . Hypertension Daughter     Review of Systems  Constitutional: Negative for chills and fever.  Respiratory: Negative for cough and shortness of breath.   Cardiovascular: Negative for chest pain, palpitations and leg swelling.  Gastrointestinal: Negative for abdominal pain, constipation, diarrhea, nausea and vomiting.    Genitourinary: Negative for dysuria and hematuria.  Musculoskeletal: Negative for joint pain and myalgias.  All other systems reviewed and are negative.    OBJECTIVE:  Blood pressure 128/82, pulse 76, temperature 97.8 F (36.6 C), temperature source Oral, height 5\' 9"  (1.753 m), weight 176 lb (79.8 kg), SpO2 98 %.   Visual Acuity Screening   Right eye Left eye Both eyes  Without correction: 20/13 20/13 20/13   With correction:       Physical Exam  Constitutional: He is oriented to person, place, and time and well-developed, well-nourished, and in no distress.  HENT:  Head: Normocephalic and atraumatic.  Right Ear: Hearing, tympanic membrane, external ear and ear canal normal.  Left Ear: Hearing, tympanic membrane, external ear and ear canal normal.  Mouth/Throat: Oropharynx is clear and moist. No oropharyngeal exudate.  Eyes: Pupils are equal, round, and reactive to light. Conjunctivae and EOM are normal.  Neck: Neck supple. No thyromegaly present.  Cardiovascular: Normal rate, regular rhythm, normal heart sounds and intact distal pulses. Exam reveals no gallop and no friction rub.  No murmur heard. Pulmonary/Chest: Effort normal and breath sounds normal. He has no wheezes. He has no rales.  Abdominal: Soft. Bowel sounds are normal. He exhibits no distension and no mass. There is no tenderness.  Musculoskeletal: Normal range of motion. He exhibits no edema.  Lymphadenopathy:    He has no cervical adenopathy.  Neurological: He is alert and oriented to person, place, and time. He has normal reflexes. No cranial nerve deficit. Gait  normal.  Skin: Skin is warm and dry.  Psychiatric: Mood and affect normal.       ASSESSMENT and PLAN 1. Annual physical exam No concerns per history or exam. Routine HCM labs ordered. HCM reviewed/discussed. Anticipatory guidance regarding healthy weight, lifestyle and choices given.  - CBC with Differential/Platelet - Urinalysis, dipstick  only  2. Dyslipidemia - Lipid panel - Comprehensive metabolic panel  3. Screening for deficiency anemia  4. Prostate cancer screening - PSA  5. Screening for thyroid disorder - TSH  Return in about 1 year (around 03/13/2019).    Myles Lipps, MD Primary Care at [facility] [address] [place], [place] [address] Ph.  [phone] Fax [phone]

## 2018-03-13 LAB — COMPREHENSIVE METABOLIC PANEL
ALT: 16 IU/L (ref 0–44)
AST: 20 IU/L (ref 0–40)
Albumin/Globulin Ratio: 1.4 (ref 1.2–2.2)
Albumin: 4.3 g/dL (ref 3.5–5.5)
Alkaline Phosphatase: 77 IU/L (ref 39–117)
BUN/Creatinine Ratio: 19 (ref 9–20)
BUN: 18 mg/dL (ref 6–24)
Bilirubin Total: 0.8 mg/dL (ref 0.0–1.2)
CO2: 24 mmol/L (ref 20–29)
Calcium: 9.1 mg/dL (ref 8.7–10.2)
Chloride: 104 mmol/L (ref 96–106)
Creatinine, Ser: 0.95 mg/dL (ref 0.76–1.27)
GFR calc Af Amer: 107 mL/min/{1.73_m2} (ref >59)
GFR calc non Af Amer: 92 mL/min/{1.73_m2} (ref >59)
Globulin, Total: 3 g/dL (ref 1.5–4.5)
Glucose: 95 mg/dL (ref 65–99)
Potassium: 4.1 mmol/L (ref 3.5–5.2)
Sodium: 142 mmol/L (ref 134–144)
Total Protein: 7.3 g/dL (ref 6.0–8.5)

## 2018-03-13 LAB — URINALYSIS, DIPSTICK ONLY
Bilirubin, UA: NEGATIVE
Glucose, UA: NEGATIVE
Ketones, UA: NEGATIVE
Leukocytes, UA: NEGATIVE
Nitrite, UA: NEGATIVE
Protein, UA: NEGATIVE
RBC, UA: NEGATIVE
Specific Gravity, UA: 1.027 (ref 1.005–1.030)
Urobilinogen, Ur: 0.2 mg/dL (ref 0.2–1.0)
pH, UA: 6.5 (ref 5.0–7.5)

## 2018-03-13 LAB — CBC WITH DIFFERENTIAL/PLATELET
Basophils Absolute: 0 10*3/uL (ref 0.0–0.2)
Basos: 0 %
EOS (ABSOLUTE): 0.2 10*3/uL (ref 0.0–0.4)
Eos: 3 %
Hematocrit: 45.9 % (ref 37.5–51.0)
Hemoglobin: 15.5 g/dL (ref 13.0–17.7)
Immature Grans (Abs): 0 10*3/uL (ref 0.0–0.1)
Immature Granulocytes: 0 %
Lymphocytes Absolute: 1.8 10*3/uL (ref 0.7–3.1)
Lymphs: 30 %
MCH: 28.7 pg (ref 26.6–33.0)
MCHC: 33.8 g/dL (ref 31.5–35.7)
MCV: 85 fL (ref 79–97)
Monocytes Absolute: 0.6 10*3/uL (ref 0.1–0.9)
Monocytes: 9 %
Neutrophils Absolute: 3.5 10*3/uL (ref 1.4–7.0)
Neutrophils: 58 %
Platelets: 197 10*3/uL (ref 150–379)
RBC: 5.4 x10E6/uL (ref 4.14–5.80)
RDW: 14 % (ref 12.3–15.4)
WBC: 6 10*3/uL (ref 3.4–10.8)

## 2018-03-13 LAB — LIPID PANEL
Chol/HDL Ratio: 4 ratio (ref 0.0–5.0)
Cholesterol, Total: 194 mg/dL (ref 100–199)
HDL: 49 mg/dL (ref >39)
LDL Calculated: 122 mg/dL — ABNORMAL HIGH (ref 0–99)
Triglycerides: 113 mg/dL (ref 0–149)
VLDL Cholesterol Cal: 23 mg/dL (ref 5–40)

## 2018-03-13 LAB — TSH: TSH: 0.911 u[IU]/mL (ref 0.450–4.500)

## 2018-03-13 LAB — PSA: Prostate Specific Ag, Serum: 0.9 ng/mL (ref 0.0–4.0)

## 2018-03-14 ENCOUNTER — Encounter: Payer: Self-pay | Admitting: Family Medicine

## 2019-05-15 ENCOUNTER — Encounter: Payer: Self-pay | Admitting: Gastroenterology

## 2019-08-27 ENCOUNTER — Telehealth: Payer: Self-pay

## 2019-08-27 NOTE — Telephone Encounter (Signed)
Recall cologaurd letter sent to patients address on file tt

## 2020-02-12 ENCOUNTER — Ambulatory Visit: Payer: Self-pay

## 2020-02-16 ENCOUNTER — Ambulatory Visit: Payer: Self-pay | Attending: Internal Medicine

## 2020-02-16 DIAGNOSIS — Z23 Encounter for immunization: Secondary | ICD-10-CM | POA: Insufficient documentation

## 2020-02-16 NOTE — Progress Notes (Signed)
Covid-19 Vaccination Clinic  Name:  Nathan Kelly    MRN: [id number] DOB: 02-17-1966  02/16/2020  Mr. Nathan Kelly was observed post Covid-19 immunization for 15 minutes without incident. He was provided with Vaccine Information Sheet and instruction to access the V-Safe system.   Mr. Nathan Kelly was instructed to call 911 with any severe reactions post vaccine: Marland Kitchen Difficulty breathing  . Swelling of face and throat  . A fast heartbeat  . A bad rash all over body  . Dizziness and weakness   Immunizations Administered    Name Date Dose VIS Date Route   Pfizer COVID-19 Vaccine 02/16/2020 10:13 AM 0.3 mL 11/21/2019 Intramuscular   Manufacturer: [employer], [employer]   Lot: [id number]   NDC: [address]-4

## 2020-03-08 ENCOUNTER — Ambulatory Visit: Payer: Self-pay | Attending: Internal Medicine

## 2020-03-08 DIAGNOSIS — Z23 Encounter for immunization: Secondary | ICD-10-CM

## 2020-03-08 NOTE — Progress Notes (Signed)
Covid-19 Vaccination Clinic  Name:  Nathan Kelly    MRN: [id number] DOB: 07-27-1966  03/08/2020  [name] was observed post Covid-19 immunization for 15 minutes without incident. He was provided with Vaccine Information Sheet and instruction to access the V-Safe system.   [name] was instructed to call 911 with any severe reactions post vaccine: Marland Kitchen Difficulty breathing  . Swelling of face and throat  . A fast heartbeat  . A bad rash all over body  . Dizziness and weakness   Immunizations Administered    Name Date Dose VIS Date Route   Pfizer COVID-19 Vaccine 03/08/2020  9:32 AM 0.3 mL 11/21/2019 Intramuscular   Manufacturer: [employer], [employer]   Lot: [id number]   NDC: [address]-4

## 2020-03-23 ENCOUNTER — Ambulatory Visit: Payer: Self-pay

## 2020-09-25 ENCOUNTER — Ambulatory Visit: Payer: Self-pay | Attending: Internal Medicine

## 2020-09-25 DIAGNOSIS — Z23 Encounter for immunization: Secondary | ICD-10-CM

## 2020-09-25 NOTE — Progress Notes (Signed)
Covid-19 Vaccination Clinic  Name:  Kathan Zawadzki    MRN: [id number] DOB: 1966/03/29  09/25/2020  Mr. Farinha was observed post Covid-19 immunization for 15 minutes without incident. He was provided with Vaccine Information Sheet and instruction to access the V-Safe system.   Mr. Steltz was instructed to call 911 with any severe reactions post vaccine: Marland Kitchen Difficulty breathing  . Swelling of face and throat  . A fast heartbeat  . A bad rash all over body  . Dizziness and weakness

## 2020-10-15 ENCOUNTER — Other Ambulatory Visit: Payer: Self-pay

## 2020-10-15 ENCOUNTER — Encounter: Payer: Self-pay | Admitting: Family Medicine

## 2020-10-15 ENCOUNTER — Ambulatory Visit (INDEPENDENT_AMBULATORY_CARE_PROVIDER_SITE_OTHER): Payer: 59 | Admitting: Family Medicine

## 2020-10-15 VITALS — BP 140/83 | HR 74 | Temp 97.6°F | Ht 70.0 in | Wt 182.0 lb

## 2020-10-15 DIAGNOSIS — I10 Essential (primary) hypertension: Secondary | ICD-10-CM

## 2020-10-15 DIAGNOSIS — E785 Hyperlipidemia, unspecified: Secondary | ICD-10-CM

## 2020-10-15 DIAGNOSIS — Z125 Encounter for screening for malignant neoplasm of prostate: Secondary | ICD-10-CM

## 2020-10-15 DIAGNOSIS — Z0001 Encounter for general adult medical examination with abnormal findings: Secondary | ICD-10-CM

## 2020-10-15 DIAGNOSIS — Z13220 Encounter for screening for lipoid disorders: Secondary | ICD-10-CM | POA: Diagnosis not present

## 2020-10-15 DIAGNOSIS — Z1159 Encounter for screening for other viral diseases: Secondary | ICD-10-CM

## 2020-10-15 DIAGNOSIS — Z1211 Encounter for screening for malignant neoplasm of colon: Secondary | ICD-10-CM

## 2020-10-15 NOTE — Progress Notes (Signed)
Patient ID: Nathan Kelly, male    DOB: 1966/05/07  Age: 54 y.o. MRN: [id number]  Chief Complaint  Patient presents with  . Annual Exam    here for annual exam with no issues at this time    Subjective:  Patient is here for his annual physical examination  He asked me about adult acne and I have no other recommendations that his dermatologist has not tried  He also asked me about dry skin in the winter.  Past medical history: Operations: None Hospitalizations: None Medications none.  He does take some vitamin D and fish oil Allergies none known  Social history: Systems developer.  Married with 3 children 14-18.  He does not smoke having quit 10 years ago.  He drinks 1 or 2 beers occasionally, not on a regular basis.  Does not use any drugs.  Does get some regular exercise doing push-ups but no cardio.  He is Buddhist but not a regular tender of the temple  Family history: Father is living at 51 has hypertension.  Mother is living at 27, diabetic and hypertensive  Review of systems: Constitutional: Unremarkable HEENT: Unremarkable Respiratory: Unremarkable GI: Unremarkable Cardiovascular: Unremarkable GU: Unremarkable Musculoskeletal: Unremarkable Dermatologic: Dry skin and acne Neurologic: Unremarkable Psychiatric: Unremarkable Endocrine: Unremarkable   Current allergies, medications, problem list, past/family and social histories reviewed.  Objective:  BP 140/83 (BP Location: Right Arm, Patient Position: Sitting, Cuff Size: Normal)   Pulse 74   Temp 97.6 F (36.4 C) (Temporal)   Ht 5\' 10"  (1.778 m)   Wt 182 lb (82.6 kg)   SpO2 100%   BMI 26.11 kg/m   Well-developed well-nourished man in no acute distress.  TMs normal.  Throat clear.  Neck supple without nodes or thyromegaly.  No carotid bruits.  Chest clear.  Heart rate without murmur.  Abdomen without mass or tenderness.  Genitorectal exam not done.  Extremities unremarkable.  Skin normal.  Assessment & Plan:    Assessment: 1. Essential hypertension, benign   2. Hyperlipidemia, unspecified hyperlipidemia type   3. Screening cholesterol level   4. Screening for prostate cancer   5. Special screening for malignant neoplasms, colon   6. Need for hepatitis C screening test       Plan: Labs pending.  See instructions.  Orders Placed This Encounter  Procedures  . Hepatitis C antibody  . Lipid Panel  . Comprehensive metabolic panel  . PSA    No orders of the defined types were placed in this encounter.        Patient Instructions       If you have lab work done today you will be contacted with your lab results within the next 2 weeks.  If you have not heard from [place] then please contact us. The fastest way to get your results is to register for My Chart.   IF you received an x-ray today, you will receive an invoice from [facility]. Please contact [facility] at [phone] with questions or concerns regarding your invoice.   IF you received labwork today, you will receive an invoice from [place]. Please contact [facility] at [phone] with questions or concerns regarding your invoice.   Our billing staff will not be able to assist you with questions regarding bills from these companies.  You will be contacted with the lab results as soon as they are available. The fastest way to get your results is to activate your My Chart account. Instructions are located on the last page  of this paperwork. If you have not heard from [place] regarding the results in 2 weeks, please contact this office.         No follow-ups on file.   Janace Hoard, MD 10/15/2020

## 2020-10-15 NOTE — Patient Instructions (Addendum)
  For the dry skin: Shower less frequently, maybe only every other day. Pat dry after the shower and apply some mineral oil as directed  Try to get more cardio type exercise  We will let you know the results of your laboratory tests in a few days  Return in 1 year     If you have lab work done today you will be contacted with your lab results within the next 2 weeks.  If you have not heard from [place] then please contact us. The fastest way to get your results is to register for My Chart.   IF you received an x-ray today, you will receive an invoice from [facility]. Please contact [facility] at [phone] with questions or concerns regarding your invoice.   IF you received labwork today, you will receive an invoice from [place]. Please contact [facility] at [phone] with questions or concerns regarding your invoice.   Our billing staff will not be able to assist you with questions regarding bills from these companies.  You will be contacted with the lab results as soon as they are available. The fastest way to get your results is to activate your My Chart account. Instructions are located on the last page of this paperwork. If you have not heard from [place] regarding the results in 2 weeks, please contact this office.

## 2020-10-16 LAB — LIPID PANEL
Chol/HDL Ratio: 3.8 ratio (ref 0.0–5.0)
Cholesterol, Total: 200 mg/dL — ABNORMAL HIGH (ref 100–199)
HDL: 53 mg/dL (ref >39)
LDL Chol Calc (NIH): 124 mg/dL — ABNORMAL HIGH (ref 0–99)
Triglycerides: 130 mg/dL (ref 0–149)
VLDL Cholesterol Cal: 23 mg/dL (ref 5–40)

## 2020-10-16 LAB — COMPREHENSIVE METABOLIC PANEL
ALT: 20 IU/L (ref 0–44)
AST: 22 IU/L (ref 0–40)
Albumin/Globulin Ratio: 1.7 (ref 1.2–2.2)
Albumin: 4.5 g/dL (ref 3.8–4.9)
Alkaline Phosphatase: 89 IU/L (ref 44–121)
BUN/Creatinine Ratio: 14 (ref 9–20)
BUN: 14 mg/dL (ref 6–24)
Bilirubin Total: 1 mg/dL (ref 0.0–1.2)
CO2: 26 mmol/L (ref 20–29)
Calcium: 9.1 mg/dL (ref 8.7–10.2)
Chloride: 102 mmol/L (ref 96–106)
Creatinine, Ser: 1.02 mg/dL (ref 0.76–1.27)
GFR calc Af Amer: 96 mL/min/{1.73_m2} (ref >59)
GFR calc non Af Amer: 83 mL/min/{1.73_m2} (ref >59)
Globulin, Total: 2.6 g/dL (ref 1.5–4.5)
Glucose: 102 mg/dL — ABNORMAL HIGH (ref 65–99)
Potassium: 4.4 mmol/L (ref 3.5–5.2)
Sodium: 142 mmol/L (ref 134–144)
Total Protein: 7.1 g/dL (ref 6.0–8.5)

## 2020-10-16 LAB — HEPATITIS C ANTIBODY: Hep C Virus Ab: 0.1 s/co ratio (ref 0.0–0.9)

## 2020-10-16 LAB — PSA: Prostate Specific Ag, Serum: 0.8 ng/mL (ref 0.0–4.0)

## 2020-12-14 ENCOUNTER — Other Ambulatory Visit: Payer: Self-pay

## 2020-12-14 ENCOUNTER — Telehealth: Payer: 59 | Admitting: Registered Nurse

## 2020-12-14 ENCOUNTER — Encounter: Payer: Self-pay | Admitting: Registered Nurse

## 2020-12-14 DIAGNOSIS — B9789 Other viral agents as the cause of diseases classified elsewhere: Secondary | ICD-10-CM

## 2020-12-14 DIAGNOSIS — J988 Other specified respiratory disorders: Secondary | ICD-10-CM

## 2020-12-14 MED ORDER — HYDROCODONE-HOMATROPINE 5-1.5 MG/5ML PO SYRP: 5.0000 mL | ORAL_SOLUTION | Freq: Every evening | ORAL | 0 refills | Status: DC | PRN

## 2020-12-14 MED ORDER — DM-GUAIFENESIN ER 30-600 MG PO TB12: 1.0000 | ORAL_TABLET | Freq: Two times a day (BID) | ORAL | 0 refills | Status: DC

## 2020-12-14 MED ORDER — BENZONATATE 100 MG PO CAPS: 100.0000 mg | ORAL_CAPSULE | Freq: Two times a day (BID) | ORAL | 0 refills | Status: DC | PRN

## 2020-12-14 NOTE — Patient Instructions (Signed)
° ° ° °  If you have lab work done today you will be contacted with your lab results within the next 2 weeks.  If you have not heard from us then please contact us. The fastest way to get your results is to register for My Chart. ° ° °IF you received an x-ray today, you will receive an invoice from [facility]. Please contact  Radiology at [phone] with questions or concerns regarding your invoice.  ° °IF you received labwork today, you will receive an invoice from [facility]. Please contact [facility] at [phone] with questions or concerns regarding your invoice.  ° °Our billing staff will not be able to assist you with questions regarding bills from these companies. ° °You will be contacted with the lab results as soon as they are available. The fastest way to get your results is to activate your My Chart account. Instructions are located on the last page of this paperwork. If you have not heard from us regarding the results in 2 weeks, please contact this office. °  ° ° ° °

## 2020-12-14 NOTE — Progress Notes (Signed)
Telemedicine Encounter- SOAP NOTE Established Patient  This telephone encounter was conducted with the patient's (or proxy's) verbal consent via audio telecommunications: yes  Patient was instructed to have this encounter in a suitably private space; and to only have persons present to whom they give permission to participate. In addition, patient identity was confirmed by use of name plus two identifiers (DOB and address).  I discussed the limitations, risks, security and privacy concerns of performing an evaluation and management service by telephone and the availability of in person appointments. I also discussed with the patient that there may be a patient responsible charge related to this service. The patient expressed understanding and agreed to proceed.  I spent a total of 15 minutes talking with the patient or their proxy.  Patient at home  Provider in office  Chief Complaint  Patient presents with  . Cough    Patient states since last week he has been experiencing cough, sore throat, and notice a lot of mucus/congestion in chest causing pain in chest and ribs. Per patient he was tested for covid last Wednesday and it was negative.    Subjective   Nathan Kelly is a 55 y.o. established patient. Telephone visit today for cough  HPI Ongoing for around 1 week. Getting better in general - no longer productive, not much sore throat, no fevers, chills, fatigue, sweats, nvd, or sensory changes However dry cough still persists. Becoming painful.  No shob or doe, no palpitations or chest pains Reports that he has been vaccinated.  COVID test last Wednesday negative No known exposure  Otherwise feeling well.    Patient Active Problem List   Diagnosis Date Noted  . Special screening for malignant neoplasms, colon 12/29/2015  . LLQ abdominal pain 12/29/2015    Past Medical History:  Diagnosis Date  . Hyperlipidemia     Current Outpatient Medications  Medication Sig  Dispense Refill  . benzonatate (TESSALON) 100 MG capsule Take 1 capsule (100 mg total) by mouth 2 (two) times daily as needed for cough. 20 capsule 0  . dextromethorphan-guaiFENesin (MUCINEX DM) 30-600 MG 12hr tablet Take 1 tablet by mouth 2 (two) times daily. 20 tablet 0  . HYDROcodone-homatropine (HYCODAN) 5-1.5 MG/5ML syrup Take 5 mLs by mouth at bedtime as needed for cough. 120 mL 0  . Omega-3 1000 MG CAPS Take by mouth. (Patient not taking: Reported on 12/14/2020)     No current facility-administered medications for this visit.    No Known Allergies  Social History   Socioeconomic History  . Marital status: Married    Spouse name: [name]  . Number of children: 3  . Years of education: Not on file  . Highest education level: Not on file  Occupational History  . Occupation: Advertising account planner    CommentEngineer, mining  Tobacco Use  . Smoking status: Former Smoker    Packs/day: 0.50    Years: 30.00    Pack years: 15.00    Quit date: 12/11/2008    Years since quitting: 12.0  . Smokeless tobacco: Never Used  Substance and Sexual Activity  . Alcohol use: No    Alcohol/week: 0.0 standard drinks  . Drug use: No  . Sexual activity: Yes    Partners: Female    Birth control/protection: Surgical  Other Topics Concern  . Not on file  Social History Narrative   Moved to [place] in 1989 from [place]   Lives with wife and 3 daughters  Wears seatbelt 100%   Guns in home - yes secured   Social Determinants of Health   Financial Resource Strain: Not on file  Food Insecurity: Not on file  Transportation Needs: Not on file  Physical Activity: Not on file  Stress: Not on file  Social Connections: Not on file  Intimate Partner Violence: Not on file    Review of Systems  Constitutional: Negative.   HENT: Negative.   Eyes: Negative.   Respiratory: Positive for cough. Negative for hemoptysis, sputum production, shortness of breath and wheezing.   Cardiovascular: Negative.    Gastrointestinal: Negative.   Genitourinary: Negative.   Musculoskeletal: Negative.   Skin: Negative.   Neurological: Negative.   Endo/Heme/Allergies: Negative.   Psychiatric/Behavioral: Negative.     Objective   Vitals as reported by the patient: There were no vitals filed for this visit.  [name] was seen today for cough.  Diagnoses and all orders for this visit:  Viral respiratory infection -     HYDROcodone-homatropine (HYCODAN) 5-1.5 MG/5ML syrup; Take 5 mLs by mouth at bedtime as needed for cough. -     benzonatate (TESSALON) 100 MG capsule; Take 1 capsule (100 mg total) by mouth 2 (two) times daily as needed for cough. -     dextromethorphan-guaiFENesin (MUCINEX DM) 30-600 MG 12hr tablet; Take 1 tablet by mouth 2 (two) times daily.   PLAN  Suspect viral illness. Unsure of covid. Unsure if there would be benefit to testing given duration of symptoms. Offered to patient who declined  Sending symptom relief  If no improvement will consider course of abx  Return precautions and er precautions reviewed  Patient encouraged to call clinic with any questions, comments, or concerns.  I discussed the assessment and treatment plan with the patient. The patient was provided an opportunity to ask questions and all were answered. The patient agreed with the plan and demonstrated an understanding of the instructions.   The patient was advised to call back or seek an in-person evaluation if the symptoms worsen or if the condition fails to improve as anticipated.  I provided 15 minutes of non-face-to-face time during this encounter.  Janeece Agee, NP  Primary Care at [facility]

## 2021-06-23 ENCOUNTER — Encounter: Payer: Self-pay | Admitting: Emergency Medicine

## 2021-06-23 ENCOUNTER — Telehealth: Payer: Self-pay | Admitting: Emergency Medicine

## 2021-06-23 ENCOUNTER — Other Ambulatory Visit: Payer: Self-pay

## 2021-06-23 ENCOUNTER — Ambulatory Visit
Admission: EM | Admit: 2021-06-23 | Discharge: 2021-06-23 | Disposition: A | Discharge status: Home / Self Care | Payer: 59 | Attending: Emergency Medicine | Admitting: Emergency Medicine

## 2021-06-23 DIAGNOSIS — Z1152 Encounter for screening for COVID-19: Secondary | ICD-10-CM | POA: Diagnosis not present

## 2021-06-23 DIAGNOSIS — J069 Acute upper respiratory infection, unspecified: Secondary | ICD-10-CM | POA: Diagnosis not present

## 2021-06-23 LAB — BASIC METABOLIC PANEL
BUN/Creatinine Ratio: 14 (ref 9–20)
BUN: 13 mg/dL (ref 6–24)
CO2: 26 mmol/L (ref 20–29)
Calcium: 9.1 mg/dL (ref 8.7–10.2)
Chloride: 100 mmol/L (ref 96–106)
Creatinine, Ser: 0.95 mg/dL (ref 0.76–1.27)
Glucose: 123 mg/dL — ABNORMAL HIGH (ref 65–99)
Potassium: 4 mmol/L (ref 3.5–5.2)
Sodium: 135 mmol/L (ref 134–144)
eGFR: 95 mL/min/{1.73_m2} (ref >59)

## 2021-06-23 MED ORDER — BENZONATATE 100 MG PO CAPS: 100.0000 mg | ORAL_CAPSULE | Freq: Three times a day (TID) | ORAL | 0 refills | Status: DC

## 2021-06-23 MED ORDER — DM-GUAIFENESIN ER 30-600 MG PO TB12: 1.0000 | ORAL_TABLET | Freq: Two times a day (BID) | ORAL | 0 refills | Status: DC

## 2021-06-23 MED ORDER — NIRMATRELVIR/RITONAVIR (PAXLOVID)TABLET: 3.0000 | ORAL_TABLET | Freq: Two times a day (BID) | ORAL | 0 refills | Status: AC

## 2021-06-23 NOTE — Telephone Encounter (Signed)
At home test positive, expresses interest in antivirals, BMP showing GFR of 95 today.  We will send in Paxlovid. Voicemail left.

## 2021-06-23 NOTE — Discharge Instructions (Addendum)
COVID test pending for screening, we are checking your kidney function If positive I will send in antivirals Tessalon every 8 hours for cough Mucinex DM twice daily for cough and congestion Rest and fluids Tylenol and ibuprofen as needed for headache, body aches Please follow-up if not improving or worsening

## 2021-06-23 NOTE — ED Triage Notes (Signed)
Patient presents to [facility] for evaluation of cough, body aches, and headache since Tuesday.

## 2021-06-23 NOTE — ED Provider Notes (Signed)
[facility]    CSN: [id number] Arrival date & time: 06/23/21  1202      History   Chief Complaint Chief Complaint  Patient presents with   URI    HPI Nathan Kelly is a 55 y.o. male history of hyperlipidemia presenting today for evaluation of URI symptoms.  Reports associated cough chills and body aches.  Reports symptoms began 2 days ago.  Reports at home test positive.  Has been using Advil for body aches.  Denies chest pain or shortness of breath.  Patient expresses interest in antivirals.  HPI  Past Medical History:  Diagnosis Date   Hyperlipidemia     Patient Active Problem List   Diagnosis Date Noted   Special screening for malignant neoplasms, colon 12/29/2015   LLQ abdominal pain 12/29/2015    History reviewed. No pertinent surgical history.     Home Medications    Prior to Admission medications   Medication Sig Start Date End Date Taking? Authorizing Provider  benzonatate (TESSALON) 100 MG capsule Take 1-2 capsules (100-200 mg total) by mouth every 8 (eight) hours. 06/23/21  Yes Ishmael Berkovich C, PA-C  dextromethorphan-guaiFENesin (MUCINEX DM) 30-600 MG 12hr tablet Take 1 tablet by mouth 2 (two) times daily. 06/23/21  Yes Edgerrin Correia C, PA-C  Omega-3 1000 MG CAPS Take by mouth. Patient not taking: Reported on 12/14/2020    [provider]    Family History Family History  Problem Relation Age of Onset   Diabetes Mother    Hypertension Mother    Hypertension Daughter     Social History Social History   Tobacco Use   Smoking status: Former    Packs/day: 0.50    Years: 30.00    Pack years: 15.00    Types: Cigarettes    Quit date: 12/11/2008    Years since quitting: 12.5   Smokeless tobacco: Never  Substance Use Topics   Alcohol use: No    Alcohol/week: 0.0 standard drinks   Drug use: No     Allergies   Patient has no known allergies.   Review of Systems Review of Systems  Constitutional:  Positive for chills.  Negative for activity change, appetite change, fatigue and fever.  HENT:  Positive for congestion, rhinorrhea, sinus pressure and sore throat. Negative for ear pain and trouble swallowing.   Eyes:  Negative for discharge and redness.  Respiratory:  Positive for cough. Negative for chest tightness and shortness of breath.   Cardiovascular:  Negative for chest pain.  Gastrointestinal:  Negative for abdominal pain, diarrhea, nausea and vomiting.  Musculoskeletal:  Positive for myalgias.  Skin:  Negative for rash.  Neurological:  Negative for dizziness, light-headedness and headaches.    Physical Exam Triage Vital Signs ED Triage Vitals  Enc Vitals Group     BP      Pulse      Resp      Temp      Temp src      SpO2      Weight      Height      Head Circumference      Peak Flow      Pain Score      Pain Loc      Pain Edu?      Excl. in GC?    No data found.  Updated Vital Signs BP (!) 161/93 (BP Location: Right Arm)   Pulse 82   Temp 98.5 F (36.9 C) (Oral)   Resp  18   SpO2 97%   Visual Acuity Right Eye Distance:   Left Eye Distance:   Bilateral Distance:    Right Eye Near:   Left Eye Near:    Bilateral Near:     Physical Exam Vitals and nursing note reviewed.  Constitutional:      Appearance: He is well-developed.     Comments: No acute distress  HENT:     Head: Normocephalic and atraumatic.     Ears:     Comments: Bilateral ears without tenderness to palpation of external auricle, tragus and mastoid, EAC's without erythema or swelling, TM's with good bony landmarks and cone of light. Non erythematous.      Nose: Nose normal.     Mouth/Throat:     Comments: Oral mucosa pink and moist, no tonsillar enlargement or exudate. Posterior pharynx patent and nonerythematous, no uvula deviation or swelling. Normal phonation.  Eyes:     Conjunctiva/sclera: Conjunctivae normal.  Cardiovascular:     Rate and Rhythm: Normal rate and regular rhythm.  Pulmonary:      Effort: Pulmonary effort is normal. No respiratory distress.     Comments: Breathing comfortably at rest, CTABL, no wheezing, rales or other adventitious sounds auscultated  Abdominal:     General: There is no distension.  Musculoskeletal:        General: Normal range of motion.     Cervical back: Neck supple.  Skin:    General: Skin is warm and dry.  Neurological:     Mental Status: He is alert and oriented to person, place, and time.     UC Treatments / Results  Labs (all labs ordered are listed, but only abnormal results are displayed) Labs Reviewed  NOVEL CORONAVIRUS, NAA  BASIC METABOLIC PANEL    EKG   Radiology No results found.  Procedures Procedures (including critical care time)  Medications Ordered in UC Medications - No data to display  Initial Impression / Assessment and Plan / UC Course  I have reviewed the triage vital signs and the nursing notes.  Pertinent labs & imaging results that were available during my care of the patient were reviewed by me and considered in my medical decision making (see chart for details).     Viral URI with cough-COVID test pending for screening/confirmation, reports at home test positive.  Will check BMP to check kidney function and will send in Paxlovid if positive and kidney function stable.  Tessalon and Mucinex for cough and congestion rest and fluids.  Discussed strict return precautions. Patient verbalized understanding and is agreeable with plan.  Final Clinical Impressions(s) / UC Diagnoses   Final diagnoses:  Encounter for screening for COVID-19  Viral URI with cough     Discharge Instructions      COVID test pending for screening, we are checking your kidney function If positive I will send in antivirals Tessalon every 8 hours for cough Mucinex DM twice daily for cough and congestion Rest and fluids Tylenol and ibuprofen as needed for headache, body aches Please follow-up if not improving or  worsening     ED Prescriptions     Medication Sig Dispense Auth. Provider   benzonatate (TESSALON) 100 MG capsule Take 1-2 capsules (100-200 mg total) by mouth every 8 (eight) hours. 30 capsule Cierah Crader C, PA-C   dextromethorphan-guaiFENesin (MUCINEX DM) 30-600 MG 12hr tablet Take 1 tablet by mouth 2 (two) times daily. 14 tablet Gardner Servantes, Chauncey C, PA-C      PDMP not  reviewed this encounter.   Lew Dawes, PA-C 06/23/21 1310

## 2021-06-24 LAB — NOVEL CORONAVIRUS, NAA: SARS-CoV-2, NAA: DETECTED — AB

## 2021-06-24 LAB — SARS-COV-2, NAA 2 DAY TAT

## 2022-03-28 ENCOUNTER — Encounter: Payer: Self-pay | Admitting: Family Medicine

## 2022-03-28 ENCOUNTER — Ambulatory Visit: Payer: Self-pay | Admitting: Family Medicine

## 2022-03-28 ENCOUNTER — Ambulatory Visit (INDEPENDENT_AMBULATORY_CARE_PROVIDER_SITE_OTHER): Payer: 59 | Admitting: Family Medicine

## 2022-03-28 VITALS — BP 140/90 | HR 63 | Temp 97.0°F | Ht 70.0 in | Wt 178.2 lb

## 2022-03-28 DIAGNOSIS — R739 Hyperglycemia, unspecified: Secondary | ICD-10-CM | POA: Diagnosis not present

## 2022-03-28 DIAGNOSIS — E559 Vitamin D deficiency, unspecified: Secondary | ICD-10-CM

## 2022-03-28 DIAGNOSIS — E785 Hyperlipidemia, unspecified: Secondary | ICD-10-CM

## 2022-03-28 DIAGNOSIS — Z125 Encounter for screening for malignant neoplasm of prostate: Secondary | ICD-10-CM | POA: Diagnosis not present

## 2022-03-28 DIAGNOSIS — L700 Acne vulgaris: Secondary | ICD-10-CM | POA: Diagnosis not present

## 2022-03-28 DIAGNOSIS — E663 Overweight: Secondary | ICD-10-CM | POA: Diagnosis not present

## 2022-03-28 HISTORY — DX: Hyperglycemia, unspecified: R73.9

## 2022-03-28 LAB — LIPID PANEL
Cholesterol: 209 mg/dL — ABNORMAL HIGH (ref 0–200)
HDL: 50.8 mg/dL (ref >39.00)
LDL Cholesterol: 124 mg/dL — ABNORMAL HIGH (ref 0–99)
NonHDL: 158.1
Total CHOL/HDL Ratio: 4
Triglycerides: 169 mg/dL — ABNORMAL HIGH (ref 0.0–149.0)
VLDL: 33.8 mg/dL (ref 0.0–40.0)

## 2022-03-28 LAB — COMPREHENSIVE METABOLIC PANEL
ALT: 24 U/L (ref 0–53)
AST: 24 U/L (ref 0–37)
Albumin: 4.1 g/dL (ref 3.5–5.2)
Alkaline Phosphatase: 78 U/L (ref 39–117)
BUN: 14 mg/dL (ref 6–23)
CO2: 31 mEq/L (ref 19–32)
Calcium: 8.7 mg/dL (ref 8.4–10.5)
Chloride: 104 mEq/L (ref 96–112)
Creatinine, Ser: 0.99 mg/dL (ref 0.40–1.50)
GFR: 85.64 mL/min (ref >60.00)
Glucose, Bld: 98 mg/dL (ref 70–99)
Potassium: 4.1 mEq/L (ref 3.5–5.1)
Sodium: 140 mEq/L (ref 135–145)
Total Bilirubin: 0.9 mg/dL (ref 0.2–1.2)
Total Protein: 6.8 g/dL (ref 6.0–8.3)

## 2022-03-28 LAB — PSA: PSA: 0.53 ng/mL (ref 0.10–4.00)

## 2022-03-28 LAB — HEMOGLOBIN A1C: Hgb A1c MFr Bld: 5.8 % (ref 4.6–6.5)

## 2022-03-28 NOTE — Assessment & Plan Note (Signed)
Check lipid today 

## 2022-03-28 NOTE — Patient Instructions (Signed)
It was a pleasure to see you today! Today we are getting labs as listed. Please call with any concerns.  ? ? ?

## 2022-03-28 NOTE — Progress Notes (Signed)
Patient w/ elevated cholesterol. Mild to moderate risk for heart attack. Please have patient follow up in two weeks to discuss possible treatments.  ?

## 2022-03-28 NOTE — Progress Notes (Signed)
? ?New Patient Office Visit ? ?Subjective   ? ?Patient ID: Nathan Kelly, male    DOB: [birth year]  Age: 56 y.o. MRN: [id number] ? ?CC:  ?Chief Complaint  ?Patient presents with  ? Establish Care  ?  Np est care. No concerns.  ? ? ?HPI ?Nathan Kelly presents to establish care. Wants to get lipid panel and other labs.  ? ? ? ?Outpatient Encounter Medications as of 03/28/2022  ?Medication Sig  ? cholecalciferol (VITAMIN D3) 25 MCG (1000 UNIT) tablet Take 1,000 Units by mouth daily.  ? minocycline (MINOCIN) 100 MG capsule Take 100 mg by mouth 2 (two) times daily.  ? Omega-3 1000 MG CAPS Take by mouth.  ? [DISCONTINUED] benzonatate (TESSALON) 100 MG capsule Take 1-2 capsules (100-200 mg total) by mouth every 8 (eight) hours.  ? [DISCONTINUED] dextromethorphan-guaiFENesin (MUCINEX DM) 30-600 MG 12hr tablet Take 1 tablet by mouth 2 (two) times daily.  ? ?No facility-administered encounter medications on file as of 03/28/2022.  ? ? ?Past Medical History:  ?Diagnosis Date  ? Hyperlipidemia   ? ? ?History reviewed. No pertinent surgical history. ? ?Family History  ?Problem Relation Age of Onset  ? Diabetes Mother   ? Hypertension Mother   ? Hypertension Daughter   ? ? ?Social History  ? ?Socioeconomic History  ? Marital status: Married  ?  Spouse name: Nathan Kelly  ? Number of children: 3  ? Years of education: Not on file  ? Highest education level: Not on file  ?Occupational History  ? Occupation: Advertising account planner  ?  Comment: [employer]  ?Tobacco Use  ? Smoking status: Former  ?  Packs/day: 0.50  ?  Years: 30.00  ?  Pack years: 15.00  ?  Types: Cigarettes  ?  Quit date: 12/11/2008  ?  Years since quitting: 13.3  ? Smokeless tobacco: Never  ?Vaping Use  ? Vaping Use: Never used  ?Substance and Sexual Activity  ? Alcohol use: No  ?  Alcohol/week: 0.0 standard drinks  ? Drug use: No  ? Sexual activity: Yes  ?  Partners: Female  ?  Birth control/protection: Surgical  ?Other Topics Concern  ? Not on file  ?Social History Narrative  ?  Moved to [place] in 1989 from [place]  ? Lives with wife and 3 daughters  ?   ? Wears seatbelt 100%  ? Guns in home - yes secured  ? ?Social Determinants of Health  ? ?Financial Resource Strain: Not on file  ?Food Insecurity: Not on file  ?Transportation Needs: Not on file  ?Physical Activity: Not on file  ?Stress: Not on file  ?Social Connections: Not on file  ?Intimate Partner Violence: Not on file  ? ? ?ROS ? ?  ? ? ?Objective   ? ?BP 140/90 (BP Location: Left Arm, Patient Position: Sitting, Cuff Size: Normal)   Pulse 63   Temp (!) 97 ?F (36.1 ?C) (Temporal)   Ht 5\' 10"  (1.778 m)   Wt 178 lb 3.2 oz (80.8 kg)   SpO2 93%   BMI 25.57 kg/m?  ? ?Physical Exam ? ? ?  ? ?Assessment & Plan:  ? ?Problem List Items Addressed This Visit   ? ?  ? Musculoskeletal and Integument  ? Acne vulgaris  ?  Follows with dermatology ?Stable on minocycline 100 mg daily and dietary modification ? ?  ?  ? Relevant Medications  ? minocycline (MINOCIN) 100 MG capsule  ?  ? Other  ? Vitamin D deficiency  ?  Self reported in the past ?Takes OTC supplements ?We will check levels today ? ?  ?  ? Relevant Orders  ? Vitamin D 1,25 dihydroxy  ? Hyperlipidemia  ?  Check lipid today ? ?  ?  ? Relevant Orders  ? Lipid Profile  ? Hemoglobin A1C  ? Comp Met (CMET)  ? Overweight (BMI 25.0-29.9)  ?  Mildly ?Exercises and eats healthy diet ?Encouraged to continue these lifestyle modifications ? ? ?  ?  ? Relevant Orders  ? Lipid Profile  ? Hemoglobin A1C  ? Comp Met (CMET)  ? Hyperglycemia - Primary  ?  Mild past 1 year ago ?Check hemoglobin A1c ? ? ?  ?  ? Relevant Orders  ? Lipid Profile  ? Hemoglobin A1C  ? Comp Met (CMET)  ? ?Other Visit Diagnoses   ? ? Screening for prostate cancer      ? Relevant Orders  ? PSA  ? ?  ? ? ?Return in about 6 months (around 09/27/2022).  ? ?Nathan Gunner, MD ? ? ?

## 2022-03-28 NOTE — Assessment & Plan Note (Signed)
Self reported in the past ?Takes OTC supplements ?We will check levels today ?

## 2022-03-28 NOTE — Assessment & Plan Note (Signed)
Mild past 1 year ago ?Check hemoglobin A1c ? ?

## 2022-03-28 NOTE — Assessment & Plan Note (Signed)
Mildly ?Exercises and eats healthy diet ?Encouraged to continue these lifestyle modifications ? ?

## 2022-03-28 NOTE — Assessment & Plan Note (Signed)
Follows with dermatology ?Stable on minocycline 100 mg daily and dietary modification ?

## 2022-04-04 LAB — VITAMIN D 1,25 DIHYDROXY
Vitamin D 1, 25 (OH)2 Total: 37 pg/mL (ref 18–72)
Vitamin D2 1, 25 (OH)2: <8 pg/mL
Vitamin D3 1, 25 (OH)2: 37 pg/mL

## 2022-05-30 ENCOUNTER — Ambulatory Visit (INDEPENDENT_AMBULATORY_CARE_PROVIDER_SITE_OTHER): Payer: 59 | Admitting: Family Medicine

## 2022-05-30 ENCOUNTER — Encounter: Payer: Self-pay | Admitting: Family Medicine

## 2022-05-30 ENCOUNTER — Other Ambulatory Visit: Payer: Self-pay | Admitting: Family Medicine

## 2022-05-30 VITALS — BP 160/97 | HR 63 | Temp 97.1°F | Ht 70.0 in | Wt 180.0 lb

## 2022-05-30 DIAGNOSIS — L700 Acne vulgaris: Secondary | ICD-10-CM

## 2022-05-30 DIAGNOSIS — E782 Mixed hyperlipidemia: Secondary | ICD-10-CM | POA: Diagnosis not present

## 2022-05-30 DIAGNOSIS — I10 Essential (primary) hypertension: Secondary | ICD-10-CM | POA: Diagnosis not present

## 2022-05-30 MED ORDER — AMLODIPINE BESYLATE 5 MG PO TABS: 5.0000 mg | ORAL_TABLET | Freq: Every day | ORAL | 0 refills | Status: DC

## 2022-05-30 MED ORDER — DOXYCYCLINE HYCLATE 100 MG PO TBEC: 100.0000 mg | DELAYED_RELEASE_TABLET | Freq: Two times a day (BID) | ORAL | 2 refills | Status: DC

## 2022-05-30 MED ORDER — TRETINOIN 0.025 % EX CREA: TOPICAL_CREAM | Freq: Every day | CUTANEOUS | 0 refills | Status: DC

## 2022-05-30 NOTE — Assessment & Plan Note (Addendum)
Discussed elevated ASCVD risk and risk for heart attack and stroke Recommend starting statin, patient declines Patient wants to work on lifestyle modifications and taking fish oil Repeat lipid in 6 months

## 2022-05-30 NOTE — Assessment & Plan Note (Signed)
DC minocycline Start doxycycline 100 mg twice daily Retin-A Micro applied daily Follow-up in 3 months

## 2022-05-30 NOTE — Assessment & Plan Note (Signed)
Start amlodipine Follow-up in 3 months

## 2022-05-30 NOTE — Progress Notes (Signed)
   Pinchos Topel is a 56 y.o. male who presents today for an office visit.  Assessment/Plan:    Chronic Problems Addressed Today: Primary hypertension Start amlodipine Follow-up in 3 months  Acne vulgaris DC minocycline Start doxycycline 100 mg twice daily Retin-A Micro applied daily Follow-up in 3 months     Hyperlipidemia Discussed elevated ASCVD risk and risk for heart attack and stroke Recommend starting statin, patient declines Patient wants to work on lifestyle modifications and taking fish oil Repeat lipid in 6 months     Subjective:  HPI:  Acting vulgaris Patient has been taking minocycline for several years.  He is still has ongoing symptoms.  He is interested in trying something else.  Hypertension, follow-up  BP Readings from Last 3 Encounters:  05/30/22 (!) 160/97  03/28/22 140/90  06/23/21 (!) 161/93   Wt Readings from Last 3 Encounters:  05/30/22 180 lb (81.6 kg)  03/28/22 178 lb 3.2 oz (80.8 kg)  10/15/20 182 lb (82.6 kg)     He was last seen for hypertension 3 weeks ago.  Management since that visit includes lifestyle modifications.  He reports fair compliance with treatment. He is not having side effects.  He is following a Low fat, Low Sodium diet. He is exercising. He does not smoke.  Use of agents associated with hypertension: none.    Symptoms: No chest pain No chest pressure  No palpitations No syncope  No dyspnea No orthopnea  No paroxysmal nocturnal dyspnea No lower extremity edema   Pertinent labs Lab Results  Component Value Date   CHOL 209 (H) 03/28/2022   HDL 50.80 03/28/2022   LDLCALC 124 (H) 03/28/2022   TRIG 169.0 (H) 03/28/2022   CHOLHDL 4 03/28/2022   Lab Results  Component Value Date   NA 140 03/28/2022   K 4.1 03/28/2022   CREATININE 0.99 03/28/2022   EGFR 95 06/23/2021   GLUCOSE 98 03/28/2022   TSH 0.911 03/12/2018     The 10-year ASCVD risk score (Arnett DK, et al., 2019) is:  9.9%  ---------------------------------------------------------------------------------------------------        Objective:  Physical Exam: BP (!) 160/97 (BP Location: Right Arm, Patient Position: Sitting, Cuff Size: Normal)   Pulse 63   Temp (!) 97.1 F (36.2 C) (Temporal)   Ht $R'5\' 10"'kX$  (1.778 m)   Wt 180 lb (81.6 kg)   SpO2 98%   BMI 25.83 kg/m   Gen: No acute distress, resting comfortably CV: Regular rate and rhythm with no murmurs appreciated Pulm: Normal work of breathing, clear to auscultation bilaterally with no crackles, wheezes, or rhonchi Skin: Some pitting and scarring from prior acne, acne on the face Neuro: Grossly normal, moves all extremities Psych: Normal affect and thought content      Alesia Banda, MD, MS

## 2022-05-30 NOTE — Patient Instructions (Signed)
For acne, take doxycyline and tretinoin. Wear sun screen outside For blood pressure, consider amlodipine.

## 2022-06-01 ENCOUNTER — Other Ambulatory Visit: Payer: Self-pay | Admitting: Family Medicine

## 2022-06-01 DIAGNOSIS — L700 Acne vulgaris: Secondary | ICD-10-CM

## 2022-06-01 MED ORDER — DOXYCYCLINE MONOHYDRATE 100 MG PO CAPS: 100.0000 mg | ORAL_CAPSULE | Freq: Two times a day (BID) | ORAL | 2 refills | Status: DC

## 2022-06-02 ENCOUNTER — Telehealth: Payer: Self-pay | Admitting: Family Medicine

## 2022-06-05 ENCOUNTER — Other Ambulatory Visit: Payer: Self-pay | Admitting: Family Medicine

## 2022-06-23 ENCOUNTER — Telehealth: Payer: Self-pay | Admitting: Family Medicine

## 2022-06-23 NOTE — Telephone Encounter (Signed)
Please advise, see below.  Any alternatives? 

## 2022-06-23 NOTE — Telephone Encounter (Signed)
Caller Name: Reinhold Rickey  Call back phone #: [phone]  Reason for Call: Pt called in asking to speak with Dr. Janee Morn about his prescription doxycycline, he states that he has seen no improvement and would like to switch to a different med.

## 2022-06-26 NOTE — Telephone Encounter (Signed)
Patient scheduled on 06/27/22 at 4 pm with PCP.

## 2022-06-27 ENCOUNTER — Ambulatory Visit (INDEPENDENT_AMBULATORY_CARE_PROVIDER_SITE_OTHER): Payer: 59 | Admitting: Family Medicine

## 2022-06-27 ENCOUNTER — Encounter: Payer: Self-pay | Admitting: Family Medicine

## 2022-06-27 VITALS — BP 138/86 | HR 67 | Temp 97.4°F | Wt 180.2 lb

## 2022-06-27 DIAGNOSIS — L309 Dermatitis, unspecified: Secondary | ICD-10-CM

## 2022-06-27 DIAGNOSIS — I10 Essential (primary) hypertension: Secondary | ICD-10-CM

## 2022-06-27 DIAGNOSIS — L700 Acne vulgaris: Secondary | ICD-10-CM | POA: Diagnosis not present

## 2022-06-27 HISTORY — DX: Dermatitis, unspecified: L30.9

## 2022-06-27 MED ORDER — CLINDAMYCIN PHOS-BENZOYL PEROX 1-5 % EX GEL: Freq: Two times a day (BID) | CUTANEOUS | 3 refills | Status: DC

## 2022-06-27 MED ORDER — HYDROCORTISONE 2.5 % EX OINT: TOPICAL_OINTMENT | Freq: Two times a day (BID) | CUTANEOUS | 3 refills | Status: DC

## 2022-06-27 MED ORDER — MINOCYCLINE HCL 100 MG PO CAPS: 100.0000 mg | ORAL_CAPSULE | Freq: Two times a day (BID) | ORAL | 0 refills | Status: DC

## 2022-06-27 NOTE — Progress Notes (Signed)
Assessment/Plan:   Problem List Items Addressed This Visit       Cardiovascular and Mediastinum   Primary hypertension    Stable Improved with lifestyle modifications Self discontinued amlodipine Continue to monitor follow-up in 3 months        Musculoskeletal and Integument   Acne vulgaris - Primary    Chronic Unstable No improvement on doxycycline or tretinoin DC dicyclomine and  Restart minocycline 100 mg twice daily Trial of benzyl peroxide/clindamycin gel as ordered Encourage patient to use sunscreen and reduce sun exposure      Relevant Medications   clindamycin-benzoyl peroxide (BENZACLIN) gel   minocycline (MINOCIN) 100 MG capsule   Eczema    Mild  Trial hydrocortisone 2.5% as needed      Relevant Medications   hydrocortisone 2.5 % ointment       Subjective:  HPI:  Nathan Kelly is a 56 y.o. male who has Vitamin D deficiency; Hyperlipidemia; Acne vulgaris; Overweight (BMI 25.0-29.9); Hyperglycemia; Primary hypertension; and Eczema on their problem list..   He  has a past medical history of Hyperlipidemia.Marland Kitchen   He presents with chief complaint of Follow-up (No improvement with doxycyline ) .   Patient had follow-up on acne vulgaris.  Patient has been on minocycline in the past, will switch doxycycline as he felt that the minocycline was not working.  He reports now that the doxycycline is working less than the minocycline did.  He is also tried tretinoin, but this did not improve acne either.  Patient's children for acne take clindamycin/benzyl peroxide gel.  He is also interested in trying this.  Patient has stopped taking OTC supplements and amlodipine.   Patient also complains of history of itchy dry rash on abdomen.  He has tried over-the-counter hydrocortisone and this is helped some but not completely resolved issue he does apply lotion which also helps.  Hypertension, established problem, Stable BP Readings from Last 3 Encounters:  06/27/22  138/86  05/30/22 (!) 160/97  03/28/22 140/90   Current Medications: Self discontinued amlodipine, compliant without side effects. Interim History: Has been working on lifestyle modification including diet and exercise  ROS: Denies any chest pain, shortness of breath, dyspnea on exertion, leg edema.       No past surgical history on file.  Outpatient Medications Prior to Visit  Medication Sig Dispense Refill   cholecalciferol (VITAMIN D3) 25 MCG (1000 UNIT) tablet Take 1,000 Units by mouth daily.     Omega-3 1000 MG CAPS Take by mouth.     doxycycline (MONODOX) 100 MG capsule Take 1 capsule (100 mg total) by mouth 2 (two) times daily. 60 capsule 2   amLODipine (NORVASC) 5 MG tablet Take 1 tablet (5 mg total) by mouth daily. (Patient not taking: Reported on 06/27/2022) 90 tablet 0   tretinoin (RETIN-A) 0.025 % cream Apply topically at bedtime. (Patient not taking: Reported on 06/27/2022) 45 g 0   No facility-administered medications prior to visit.    Family History  Problem Relation Age of Onset   Diabetes Mother    Hypertension Mother    Hypertension Daughter     Social History   Socioeconomic History   Marital status: Married    Spouse name: [name]   Number of children: 3   Years of education: Not on file   Highest education level: Not on file  Occupational History   Occupation: Advertising account planner    Comment: [employer]  Tobacco Use   Smoking status: Former  Packs/day: 0.50    Years: 30.00    Total pack years: 15.00    Types: Cigarettes    Quit date: 12/11/2008    Years since quitting: 13.5   Smokeless tobacco: Never  Vaping Use   Vaping Use: Never used  Substance and Sexual Activity   Alcohol use: No    Alcohol/week: 0.0 standard drinks of alcohol   Drug use: No   Sexual activity: Yes    Partners: Female    Birth control/protection: Surgical  Other Topics Concern   Not on file  Social History Narrative   Moved to [place] in 1989 from [place]   Lives  with wife and 3 daughters      Wears seatbelt 100%   Guns in home - yes secured   Social Determinants of Health   Financial Resource Strain: Not on file  Food Insecurity: Not on file  Transportation Needs: Not on file  Physical Activity: Not on file  Stress: Not on file  Social Connections: Not on file  Intimate Partner Violence: Not on file                                                                                                 Objective:  Physical Exam: BP 138/86 (BP Location: Left Arm, Patient Position: Sitting, Cuff Size: Large)   Pulse 67   Temp (!) 97.4 F (36.3 C) (Temporal)   Wt 180 lb 3.2 oz (81.7 kg)   SpO2 97%   BMI 25.86 kg/m    General: No acute distress. Awake and conversant.  Eyes: Normal conjunctiva, anicteric. Round symmetric pupils.  ENT: Hearing grossly intact. No nasal discharge.  Neck: Neck is supple. No masses or thyromegaly.  Respiratory: Respirations are non-labored. No auditory wheezing.  Skin: Warm.  Acne vulgaris on the face and neck with comedones, there is dry scaling on the right wall of the Psych: Alert and oriented. Cooperative, Appropriate mood and affect, Normal judgment.  CV: No cyanosis or JVD MSK: Normal ambulation. No clubbing  Neuro: Sensation and CN II-XII grossly normal.        Garner Nash, MD, MS

## 2022-06-27 NOTE — Assessment & Plan Note (Signed)
Mild  Trial hydrocortisone 2.5% as needed

## 2022-06-27 NOTE — Assessment & Plan Note (Signed)
Stable Improved with lifestyle modifications Self discontinued amlodipine Continue to monitor follow-up in 3 months

## 2022-06-27 NOTE — Assessment & Plan Note (Signed)
Chronic Unstable No improvement on doxycycline or tretinoin DC dicyclomine and  Restart minocycline 100 mg twice daily Trial of benzyl peroxide/clindamycin gel as ordered Encourage patient to use sunscreen and reduce sun exposure

## 2022-07-18 ENCOUNTER — Encounter: Payer: Self-pay | Admitting: Family Medicine

## 2022-07-18 ENCOUNTER — Ambulatory Visit (INDEPENDENT_AMBULATORY_CARE_PROVIDER_SITE_OTHER): Payer: 59 | Admitting: Family Medicine

## 2022-07-18 VITALS — BP 160/88 | HR 76 | Temp 97.0°F | Ht 70.0 in | Wt 178.0 lb

## 2022-07-18 DIAGNOSIS — I10 Essential (primary) hypertension: Secondary | ICD-10-CM | POA: Diagnosis not present

## 2022-07-18 NOTE — Progress Notes (Signed)
Established Patient Office Visit  Subjective   Patient ID: Nathan Kelly, male    DOB: 1966/10/20  Age: 56 y.o. MRN: [id number]  Chief Complaint  Patient presents with   Hypertension    Concerns about elevated BP readings at home.     Hypertension Pertinent negatives include no blurred vision, chest pain, headaches or shortness of breath.   for follow-up of elevated blood pressure.  Amlodipine has been held to allow him to try lifestyle changes.  He does not smoke or use illicit drugs.  Rarely drinks alcohol and when he does he may have a glass of wine with a meal on occasion.  Blood pressure has been elevated at home with his wrist cuff running in the 160/90 range.  He denies headaches dizziness blurred vision or difficulty breathing.  More recently he had eaten at a [employer] [place] just prior to his seeing his highest numbers.  He has been taking amlodipine for 2 days.    Review of Systems  Constitutional: Negative.  Negative for diaphoresis.  HENT: Negative.    Eyes:  Negative for blurred vision, discharge and redness.  Respiratory: Negative.  Negative for shortness of breath.   Cardiovascular: Negative.  Negative for chest pain.  Gastrointestinal:  Negative for abdominal pain and nausea.  Genitourinary: Negative.   Musculoskeletal: Negative.  Negative for myalgias.  Skin:  Negative for rash.  Neurological:  Negative for tingling, loss of consciousness, weakness and headaches.  Endo/Heme/Allergies:  Negative for polydipsia.  Psychiatric/Behavioral: Negative.        Objective:     BP (!) 160/88 (BP Location: Right Arm, Patient Position: Sitting, Cuff Size: Normal)   Pulse 76   Temp (!) 97 F (36.1 C) (Temporal)   Ht 5\' 10"  (1.778 m)   Wt 178 lb (80.7 kg)   SpO2 99%   BMI 25.54 kg/m  BP Readings from Last 3 Encounters:  07/18/22 (!) 160/88  06/27/22 138/86  05/30/22 (!) 160/97   Wt Readings from Last 3 Encounters:  07/18/22 178 lb (80.7 kg)  06/27/22 180 lb  3.2 oz (81.7 kg)  05/30/22 180 lb (81.6 kg)      Physical Exam Constitutional:      General: He is not in acute distress.    Appearance: Normal appearance. He is not ill-appearing, toxic-appearing or diaphoretic.  HENT:     Head: Normocephalic and atraumatic.     Right Ear: External ear normal.     Left Ear: External ear normal.  Eyes:     General: No scleral icterus.       Right eye: No discharge.        Left eye: No discharge.     Extraocular Movements: Extraocular movements intact.     Conjunctiva/sclera: Conjunctivae normal.  Cardiovascular:     Rate and Rhythm: Normal rate and regular rhythm.  Pulmonary:     Effort: Pulmonary effort is normal. No respiratory distress.     Breath sounds: Normal breath sounds. No wheezing or rales.  Abdominal:     Tenderness: There is no guarding.  Skin:    General: Skin is warm and dry.  Neurological:     Mental Status: He is alert and oriented to person, place, and time.  Psychiatric:        Mood and Affect: Mood normal.        Behavior: Behavior normal.      No results found for any visits on 07/18/22.    The 10-year ASCVD  risk score (Arnett DK, et al., 2019) is: 10.8%    Assessment & Plan:   Problem List Items Addressed This Visit       Cardiovascular and Mediastinum   Primary hypertension - Primary   Relevant Medications   amLODipine (NORVASC) 5 MG tablet    Return Follow up with Dr. Janee Morn in 4 weeks..  Patient's wrist cuff brought into the clinic today measured essentially the same pressure as above.  Restart amlodipine.  He understands that it may take 4 to 6 weeks for his blood pressure to respond entirely to the medication at the 5 mg dose.  He will avoid salt loads.  May exercise by walking.  Mliss Sax, MD

## 2022-08-28 ENCOUNTER — Ambulatory Visit (INDEPENDENT_AMBULATORY_CARE_PROVIDER_SITE_OTHER): Payer: 59 | Admitting: Family Medicine

## 2022-08-28 ENCOUNTER — Encounter: Payer: Self-pay | Admitting: Family Medicine

## 2022-08-28 VITALS — BP 138/86 | HR 72 | Temp 97.6°F | Wt 179.4 lb

## 2022-08-28 DIAGNOSIS — E782 Mixed hyperlipidemia: Secondary | ICD-10-CM

## 2022-08-28 DIAGNOSIS — I10 Essential (primary) hypertension: Secondary | ICD-10-CM

## 2022-08-28 MED ORDER — ICOSAPENT ETHYL 0.5 G PO CAPS: 4.0000 | ORAL_CAPSULE | Freq: Two times a day (BID) | ORAL | 0 refills | Status: DC

## 2022-08-28 NOTE — Assessment & Plan Note (Signed)
Stable Improved with lifestyle modifications Return in about 6 months (around 02/26/2023) for BP.

## 2022-08-28 NOTE — Assessment & Plan Note (Signed)
Can add omega-3 capsules Follow-up in 6 months

## 2022-08-28 NOTE — Patient Instructions (Addendum)
Eat less salt (Sodium Chloride, NaCl) Try using salt substitute (Potassium salts, PaCl) Brands include Mortan Salt Substitute, NuSalt

## 2022-08-28 NOTE — Progress Notes (Signed)
Assessment/Plan:   Problem List Items Addressed This Visit       Cardiovascular and Mediastinum   Primary hypertension - Primary   Relevant Medications   Icosapent Ethyl (VASCEPA) 0.5 g CAPS     Other   Hyperlipidemia   Relevant Medications   Icosapent Ethyl (VASCEPA) 0.5 g CAPS       Subjective:  HPI:  Nathan Kelly is a 56 y.o. male who has Vitamin D deficiency; Hyperlipidemia; Acne vulgaris; Overweight (BMI 25.0-29.9); Hyperglycemia; Primary hypertension; and Eczema on their problem list..   He  has a past medical history of Hyperlipidemia.Marland Kitchen   He presents with chief complaint of Follow-up (6 month f/u fasting. Would like to discuss shingle vaccine) .   Hypertension, established problem, Stable BP Readings from Last 3 Encounters:  08/28/22 138/86  07/18/22 (!) 160/88  06/27/22 138/86   Home BP monitoring: None Current Medications: Previous prescribed amlodipine 10 mg, however he only took it 1 day, denies side effects Interim History: Has been working on exercising and healthy diet  ROS: Denies any chest pain, shortness of breath, dyspnea on exertion, leg edema.    Hyperlipidemia, established problem,  Current medication(s): Takes over-the-counter fish oil, but says it is very expensive and is unsure if it is helping, patient is concerned about taking medications.  Compliant without side effects.  ROS: No chest pain or shortness of breath. No myalgias.    The 10-year ASCVD risk score (Arnett DK, et al., 2019) is: 7.2%   Values used to calculate the score:     Age: 41 years     Sex: Male     Is Non-Hispanic African American: No     Diabetic: No     Tobacco smoker: No     Systolic Blood Pressure: 138 mmHg     Is BP treated: No     HDL Cholesterol: 50.8 mg/dL     Total Cholesterol: 209 mg/dL   No past surgical history on file.  Outpatient Medications Prior to Visit  Medication Sig Dispense Refill   cholecalciferol (VITAMIN D3) 25 MCG (1000 UNIT)  tablet Take 1,000 Units by mouth daily.     clindamycin-benzoyl peroxide (BENZACLIN) gel Apply topically 2 (two) times daily. 35 g 3   hydrocortisone 2.5 % ointment Apply topically 2 (two) times daily. As needed for mild eczema.  Do not use for more than 1-2 weeks at a time. 30 g 3   Omega-3 1000 MG CAPS Take by mouth.     minocycline (MINOCIN) 100 MG capsule Take 1 capsule (100 mg total) by mouth 2 (two) times daily. (Patient not taking: Reported on 07/18/2022) 180 capsule 0   amLODipine (NORVASC) 5 MG tablet Take 5 mg by mouth daily. (Patient not taking: Reported on 08/28/2022)     No facility-administered medications prior to visit.    Family History  Problem Relation Age of Onset   Diabetes Mother    Hypertension Mother    Hypertension Daughter     Social History   Socioeconomic History   Marital status: Married    Spouse name: [name]   Number of children: 3   Years of education: Not on file   Highest education level: Not on file  Occupational History   Occupation: Advertising account planner    Comment: [employer]  Tobacco Use   Smoking status: Former    Packs/day: 0.50    Years: 30.00    Total pack years: 15.00    Types: Cigarettes  Quit date: 12/11/2008    Years since quitting: 13.7   Smokeless tobacco: Never  Vaping Use   Vaping Use: Never used  Substance and Sexual Activity   Alcohol use: No    Alcohol/week: 0.0 standard drinks of alcohol   Drug use: No   Sexual activity: Yes    Partners: Female    Birth control/protection: Surgical  Other Topics Concern   Not on file  Social History Narrative   Moved to [place] in 1989 from [place]   Lives with wife and 3 daughters      Wears seatbelt 100%   Guns in home - yes secured   Social Determinants of Health   Financial Resource Strain: Not on file  Food Insecurity: Not on file  Transportation Needs: Not on file  Physical Activity: Not on file  Stress: Not on file  Social Connections: Not on file  Intimate Partner  Violence: Not on file                                                                                                 Objective:  Physical Exam: BP 138/86 (BP Location: Left Arm, Patient Position: Sitting, Cuff Size: Large)   Pulse 72   Temp 97.6 F (36.4 C) (Temporal)   Wt 179 lb 6.4 oz (81.4 kg)   SpO2 99%   BMI 25.74 kg/m    General: No acute distress. Awake and conversant.  Eyes: Normal conjunctiva, anicteric. Round symmetric pupils.  ENT: Hearing grossly intact. No nasal discharge.  Neck: Neck is supple. No masses or thyromegaly.  Respiratory: Respirations are non-labored. No auditory wheezing.  CTA B Skin: Warm. No rashes or ulcers.  Psych: Alert and oriented. Cooperative, Appropriate mood and affect, Normal judgment.  CV: No cyanosis or JVD, RRR no MRG ABD: Nontender nondistended MSK: Normal ambulation. No clubbing  Neuro: Sensation and CN II-XII grossly normal.        Alesia Banda, MD, MS

## 2022-09-25 ENCOUNTER — Ambulatory Visit: Payer: 59 | Admitting: Family Medicine

## 2022-10-14 ENCOUNTER — Other Ambulatory Visit: Payer: Self-pay | Admitting: Family Medicine

## 2022-10-14 ENCOUNTER — Encounter: Payer: Self-pay | Admitting: Family Medicine

## 2022-10-14 DIAGNOSIS — E782 Mixed hyperlipidemia: Secondary | ICD-10-CM

## 2022-10-16 MED ORDER — ICOSAPENT ETHYL 0.5 G PO CAPS: 4.0000 | ORAL_CAPSULE | Freq: Two times a day (BID) | ORAL | 0 refills | Status: DC

## 2022-10-16 NOTE — Telephone Encounter (Signed)
Chart supports rx. Last OV: 08/28/2022 Next OV: 02/26/2023

## 2022-10-31 ENCOUNTER — Encounter: Payer: Self-pay | Admitting: Family Medicine

## 2022-11-14 ENCOUNTER — Other Ambulatory Visit: Payer: Self-pay | Admitting: Family Medicine

## 2022-11-14 DIAGNOSIS — L700 Acne vulgaris: Secondary | ICD-10-CM

## 2022-11-15 ENCOUNTER — Other Ambulatory Visit: Payer: Self-pay | Admitting: Family Medicine

## 2022-11-15 DIAGNOSIS — E782 Mixed hyperlipidemia: Secondary | ICD-10-CM

## 2022-11-17 ENCOUNTER — Telehealth (INDEPENDENT_AMBULATORY_CARE_PROVIDER_SITE_OTHER): Payer: 59 | Admitting: Family Medicine

## 2022-11-17 DIAGNOSIS — I10 Essential (primary) hypertension: Secondary | ICD-10-CM | POA: Diagnosis not present

## 2022-11-17 DIAGNOSIS — L700 Acne vulgaris: Secondary | ICD-10-CM | POA: Diagnosis not present

## 2022-11-17 MED ORDER — CLINDAMYCIN PHOS-BENZOYL PEROX 1-5 % EX GEL: Freq: Two times a day (BID) | CUTANEOUS | 3 refills | Status: DC

## 2022-11-17 NOTE — Progress Notes (Unsigned)
Assessment/Plan:   Problem List Items Addressed This Visit   None      Subjective:  HPI:  Nathan Kelly is a 56 y.o. male who has Vitamin D deficiency; Hyperlipidemia; Acne vulgaris; Overweight (BMI 25.0-29.9); Hyperglycemia; Primary hypertension; and Eczema on their problem list..   He  has a past medical history of Hyperlipidemia.Marland Kitchen   He presents with chief complaint of No chief complaint on file. .  Fish oil and magnesium supplement No past surgical history on file.  Outpatient Medications Prior to Visit  Medication Sig Dispense Refill   cholecalciferol (VITAMIN D3) 25 MCG (1000 UNIT) tablet Take 1,000 Units by mouth daily.     clindamycin-benzoyl peroxide (BENZACLIN) gel Apply topically 2 (two) times daily. 35 g 3   hydrocortisone 2.5 % ointment Apply topically 2 (two) times daily. As needed for mild eczema.  Do not use for more than 1-2 weeks at a time. 30 g 3   Icosapent Ethyl 0.5 g CAPS TAKE 4 CAPSULES BY MOUTH 2 (TWO) TIMES DAILY. 240 capsule 0   minocycline (MINOCIN) 100 MG capsule TAKE 1 CAPSULE BY MOUTH TWICE A DAY 60 capsule 2   No facility-administered medications prior to visit.    Family History  Problem Relation Age of Onset   Diabetes Mother    Hypertension Mother    Hypertension Daughter     Social History   Socioeconomic History   Marital status: Married    Spouse name: [name]   Number of children: 3   Years of education: Not on file   Highest education level: Not on file  Occupational History   Occupation: Advertising account planner    Comment: [employer]  Tobacco Use   Smoking status: Former    Packs/day: 0.50    Years: 30.00    Total pack years: 15.00    Types: Cigarettes    Quit date: 12/11/2008    Years since quitting: 13.9   Smokeless tobacco: Never  Vaping Use   Vaping Use: Never used  Substance and Sexual Activity   Alcohol use: No    Alcohol/week: 0.0 standard drinks of alcohol   Drug use: No   Sexual activity: Yes    Partners:  Female    Birth control/protection: Surgical  Other Topics Concern   Not on file  Social History Narrative   Moved to [place] in 1989 from [place]   Lives with wife and 3 daughters      Wears seatbelt 100%   Guns in home - yes secured   Social Determinants of Health   Financial Resource Strain: Not on file  Food Insecurity: Not on file  Transportation Needs: Not on file  Physical Activity: Not on file  Stress: Not on file  Social Connections: Not on file  Intimate Partner Violence: Not on file                                                                                                 Objective:  Physical Exam: There were no vitals taken for this visit.   ***General: No acute distress. Awake and conversant.  Eyes: Normal conjunctiva, anicteric. Round symmetric pupils.  ENT: Hearing grossly intact. No nasal discharge.  Neck: Neck is supple. No masses or thyromegaly.  Respiratory: Respirations are non-labored. No auditory wheezing.  Skin: Warm. No rashes or ulcers.  Psych: Alert and oriented. Cooperative, Appropriate mood and affect, Normal judgment.  CV: No cyanosis or JVD MSK: Normal ambulation. No clubbing  Neuro: Sensation and CN II-XII grossly normal.        Alesia Banda, MD, MS

## 2022-11-20 MED ORDER — MAGNESIUM CITRATE 200 MG PO TABS: 1.0000 | ORAL_TABLET | Freq: Every day | ORAL | 11 refills | Status: DC

## 2022-11-28 ENCOUNTER — Ambulatory Visit: Payer: 59 | Admitting: Family Medicine

## 2022-11-28 ENCOUNTER — Encounter: Payer: Self-pay | Admitting: Family Medicine

## 2022-11-28 ENCOUNTER — Other Ambulatory Visit: Payer: Self-pay | Admitting: Family Medicine

## 2022-11-28 VITALS — BP 120/78 | HR 69 | Temp 97.8°F | Wt 179.6 lb

## 2022-11-28 DIAGNOSIS — I10 Essential (primary) hypertension: Secondary | ICD-10-CM

## 2022-11-28 DIAGNOSIS — L03011 Cellulitis of right finger: Secondary | ICD-10-CM

## 2022-11-28 DIAGNOSIS — E785 Hyperlipidemia, unspecified: Secondary | ICD-10-CM | POA: Diagnosis not present

## 2022-11-28 LAB — LIPID PANEL
Cholesterol: 203 mg/dL — ABNORMAL HIGH (ref 0–200)
HDL: 42.8 mg/dL (ref >39.00)
NonHDL: 159.9
Total CHOL/HDL Ratio: 5
Triglycerides: 266 mg/dL — ABNORMAL HIGH (ref 0.0–149.0)
VLDL: 53.2 mg/dL — ABNORMAL HIGH (ref 0.0–40.0)

## 2022-11-28 LAB — LDL CHOLESTEROL, DIRECT: Direct LDL: 93 mg/dL

## 2022-11-28 LAB — BASIC METABOLIC PANEL
BUN: 14 mg/dL (ref 6–23)
CO2: 31 mEq/L (ref 19–32)
Calcium: 8.8 mg/dL (ref 8.4–10.5)
Chloride: 102 mEq/L (ref 96–112)
Creatinine, Ser: 0.95 mg/dL (ref 0.40–1.50)
GFR: 89.56 mL/min (ref >60.00)
Glucose, Bld: 96 mg/dL (ref 70–99)
Potassium: 4.1 mEq/L (ref 3.5–5.1)
Sodium: 139 mEq/L (ref 135–145)

## 2022-11-28 LAB — HEMOGLOBIN A1C: Hgb A1c MFr Bld: 5.9 % (ref 4.6–6.5)

## 2022-11-28 MED ORDER — MAGNESIUM CHLORIDE 64 MG PO TBEC: 1.0000 | DELAYED_RELEASE_TABLET | Freq: Two times a day (BID) | ORAL | Status: DC

## 2022-11-28 MED ORDER — MUPIROCIN CALCIUM 2 % EX CREA: 1.0000 | TOPICAL_CREAM | Freq: Two times a day (BID) | CUTANEOUS | 0 refills | Status: DC

## 2022-11-28 NOTE — Progress Notes (Signed)
Assessment/Plan:   Problem List Items Addressed This Visit       Cardiovascular and Mediastinum   Primary hypertension    The patient's hypertension seemed stable at the point of examination. Adding a Magnesium supplement to his regimen seems likely safe, but we suggested lifestyle modifications might suffice. Labs, including hemoglobin A1c, BMP, lipid profile were recommended.      Relevant Medications   magnesium chloride (SLOW-MAG) 64 MG TBEC SR tablet   Other Relevant Orders   Lipid Profile (Completed)   Basic Metabolic Panel (BMET) (Completed)   Hemoglobin A1C (Completed)     Musculoskeletal and Integument   Paronychia of finger of right hand    The patient's right second and third fingers' irritation is most consistent with paronychia. We will treat these with Gentamicin topical ointment and recommend a follow-up if there's no significant improvement.        Other   Hyperlipidemia - Primary     The patient, given his history of hyperlipidemia, continues to be resistant to trialing statin although has been taking fish oil and intends to manage with lifestyle modifications. Dependent on his upcoming lab check-ups, it may be wise to recommend a statin, such as Rosuvastatin.      Relevant Medications   magnesium chloride (SLOW-MAG) 64 MG TBEC SR tablet   Other Relevant Orders   Lipid Profile (Completed)   Basic Metabolic Panel (BMET) (Completed)   Hemoglobin A1C (Completed)   Patient second and right third fingers with irritation on the nail are most consistent with paronychia.  We will treat these with gentamicin topical ointment.  Patient follow-up with no improvement  Regarding hypertension, stable today.  Discussed how adding magnesium supplement is likely safe, however did recommend that it may not be necessary, however patient wishes to trial, counseled on side effects of magnesium including diarrhea, we will also check restratification labs including hemoglobin A1c,  BMP, lipid profile  Regarding lipids, patient has been resistant to trialing statin.  He is currently taking fish oil and trying to manage with lifestyle modifications.  We will recheck this and pending on results we will consider encouraging patient to trial a statin such as rosuvastatin    Subjective:  HPI: Chief Complain: The patient, a 56 year old male, is experiencing irritation in his right second and third finger, alongside pre-existing conditions such as hypertension, hyperlipidemia, Vitamin D deficiency, and general health issues such as being overweight and hyperglycemia.  HISTORY OF PRESENT ILLNESS: The patient has reported trouble with his right fingers, particularly the second and third, claiming this started two weeks prior after a manicure. As an oversight on his end, he hadn't previously sought help but has seen no improvement either. Despite his illness, he reports no fever, chills, or any kind of drainage. The patient also has a history of high lipid levels, which he has been managing via dietary changes, exercise, and by consuming fish oil. His history also includes hypertension, which he hasn't been regularly checking at home despite managing it through lifestyle modifications.  REVIEW OF SYSTEMS: The patient exhibits no acute distress and presents with normal vitals. His right second and third fingers show irritation.  MEDICATIONS: The patient has been taking Magnesium Chloride (SLOW-MAG) 64 MG TBEC SR tablets.  PAST MEDICAL HISTORY: The patient has a past history of hyperlipidemia.  PAST SURGICAL HISTORY: Section reviewed. No pertinent history.  FAMILY HISTORY: The patient's mother experienced diabetes and hypertension as did his daughter.  SOCIAL HISTORY: The patient migrated from  [place] to the [place] in 1989 and lives with his wife and three daughters. 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 He has a past history of smoking but quit in 2010. His alcohol  consumption and drug use are non-existent, and he's sexually active with his wife.       Objective:  Physical Exam: BP 120/78 (BP Location: Left Arm, Patient Position: Sitting, Cuff Size: Large)   Pulse 69   Temp 97.8 F (36.6 C) (Temporal)   Wt 179 lb 9.6 oz (81.5 kg)   SpO2 100%   BMI 25.77 kg/m    General: No acute distress. Awake and conversant.  Eyes: Normal conjunctiva, anicteric. Round symmetric pupils.  ENT: Hearing grossly intact. No nasal discharge.  Neck: Neck is supple. No masses or thyromegaly.  Respiratory: Respirations are non-labored. No auditory wheezing.  Skin: Second and third digit nailbeds have surrounding erythema and mild tenderness to palpation Psych: Alert and oriented. Cooperative, Appropriate mood and affect, Normal judgment.  CV: No cyanosis or JVD MSK: Normal ambulation. No clubbing  Neuro: Sensation and CN II-XII grossly normal.       Garner Nash, MD, MS

## 2022-11-28 NOTE — Patient Instructions (Signed)
For magnesium, try OTC SlowMg  We are checking blood work for cholesterol, blood sugar  For nails, use mupirocin ointment

## 2022-12-03 DIAGNOSIS — L03011 Cellulitis of right finger: Secondary | ICD-10-CM | POA: Insufficient documentation

## 2022-12-03 HISTORY — DX: Cellulitis of right finger: L03.011

## 2022-12-03 NOTE — Assessment & Plan Note (Signed)
The patient's right second and third fingers' irritation is most consistent with paronychia. We will treat these with Gentamicin topical ointment and recommend a follow-up if there's no significant improvement.

## 2022-12-03 NOTE — Assessment & Plan Note (Signed)
The patient's hypertension seemed stable at the point of examination. Adding a Magnesium supplement to his regimen seems likely safe, but we suggested lifestyle modifications might suffice. Labs, including hemoglobin A1c, BMP, lipid profile were recommended.

## 2022-12-03 NOTE — Assessment & Plan Note (Signed)
The patient, given his history of hyperlipidemia, continues to be resistant to trialing statin although has been taking fish oil and intends to manage with lifestyle modifications. Dependent on his upcoming lab check-ups, it may be wise to recommend a statin, such as Rosuvastatin.

## 2022-12-16 ENCOUNTER — Other Ambulatory Visit: Payer: Self-pay | Admitting: Family Medicine

## 2022-12-16 DIAGNOSIS — E782 Mixed hyperlipidemia: Secondary | ICD-10-CM

## 2023-02-26 ENCOUNTER — Telehealth: Payer: Self-pay

## 2023-02-26 ENCOUNTER — Ambulatory Visit: Payer: 59 | Admitting: Family Medicine

## 2023-02-26 ENCOUNTER — Encounter: Payer: Self-pay | Admitting: Family Medicine

## 2023-02-26 VITALS — BP 122/78 | HR 62 | Temp 97.5°F | Wt 179.2 lb

## 2023-02-26 DIAGNOSIS — Z125 Encounter for screening for malignant neoplasm of prostate: Secondary | ICD-10-CM

## 2023-02-26 DIAGNOSIS — I10 Essential (primary) hypertension: Secondary | ICD-10-CM

## 2023-02-26 DIAGNOSIS — Z5320 Procedure and treatment not carried out because of patient's decision for unspecified reasons: Secondary | ICD-10-CM | POA: Diagnosis not present

## 2023-02-26 DIAGNOSIS — L700 Acne vulgaris: Secondary | ICD-10-CM | POA: Diagnosis not present

## 2023-02-26 DIAGNOSIS — E785 Hyperlipidemia, unspecified: Secondary | ICD-10-CM

## 2023-02-26 DIAGNOSIS — Z0000 Encounter for general adult medical examination without abnormal findings: Secondary | ICD-10-CM

## 2023-02-26 DIAGNOSIS — Z1211 Encounter for screening for malignant neoplasm of colon: Secondary | ICD-10-CM

## 2023-02-26 DIAGNOSIS — E782 Mixed hyperlipidemia: Secondary | ICD-10-CM | POA: Diagnosis not present

## 2023-02-26 DIAGNOSIS — E559 Vitamin D deficiency, unspecified: Secondary | ICD-10-CM | POA: Diagnosis not present

## 2023-02-26 LAB — COMPREHENSIVE METABOLIC PANEL
ALT: 27 U/L (ref 0–53)
AST: 26 U/L (ref 0–37)
Albumin: 3.8 g/dL (ref 3.5–5.2)
Alkaline Phosphatase: 79 U/L (ref 39–117)
BUN: 14 mg/dL (ref 6–23)
CO2: 30 mEq/L (ref 19–32)
Calcium: 8.8 mg/dL (ref 8.4–10.5)
Chloride: 103 mEq/L (ref 96–112)
Creatinine, Ser: 0.98 mg/dL (ref 0.40–1.50)
GFR: 86.13 mL/min (ref >60.00)
Glucose, Bld: 95 mg/dL (ref 70–99)
Potassium: 4.1 mEq/L (ref 3.5–5.1)
Sodium: 141 mEq/L (ref 135–145)
Total Bilirubin: 0.8 mg/dL (ref 0.2–1.2)
Total Protein: 6.7 g/dL (ref 6.0–8.3)

## 2023-02-26 LAB — CBC WITH DIFFERENTIAL/PLATELET
Basophils Absolute: 0 10*3/uL (ref 0.0–0.1)
Basophils Relative: 0.5 % (ref 0.0–3.0)
Eosinophils Absolute: 0.1 10*3/uL (ref 0.0–0.7)
Eosinophils Relative: 2.3 % (ref 0.0–5.0)
HCT: 45.9 % (ref 39.0–52.0)
Hemoglobin: 15.5 g/dL (ref 13.0–17.0)
Lymphocytes Relative: 24.8 % (ref 12.0–46.0)
Lymphs Abs: 1.1 10*3/uL (ref 0.7–4.0)
MCHC: 33.8 g/dL (ref 30.0–36.0)
MCV: 85.7 fl (ref 78.0–100.0)
Monocytes Absolute: 0.4 10*3/uL (ref 0.1–1.0)
Monocytes Relative: 8.3 % (ref 3.0–12.0)
Neutro Abs: 2.8 10*3/uL (ref 1.4–7.7)
Neutrophils Relative %: 64.1 % (ref 43.0–77.0)
Platelets: 210 10*3/uL (ref 150.0–400.0)
RBC: 5.36 Mil/uL (ref 4.22–5.81)
RDW: 13.1 % (ref 11.5–15.5)
WBC: 4.4 10*3/uL (ref 4.0–10.5)

## 2023-02-26 LAB — URINALYSIS, ROUTINE W REFLEX MICROSCOPIC
Bilirubin Urine: NEGATIVE
Hgb urine dipstick: NEGATIVE
Ketones, ur: NEGATIVE
Leukocytes,Ua: NEGATIVE
Nitrite: NEGATIVE
RBC / HPF: NONE SEEN (ref 0–?)
Specific Gravity, Urine: 1.015 (ref 1.000–1.030)
Total Protein, Urine: NEGATIVE
Urine Glucose: NEGATIVE
Urobilinogen, UA: 0.2 (ref 0.0–1.0)
WBC, UA: NONE SEEN (ref 0–?)
pH: 6.5 (ref 5.0–8.0)

## 2023-02-26 LAB — LIPID PANEL
Cholesterol: 203 mg/dL — ABNORMAL HIGH (ref 0–200)
HDL: 56.2 mg/dL (ref >39.00)
LDL Cholesterol: 113 mg/dL — ABNORMAL HIGH (ref 0–99)
NonHDL: 146.36
Total CHOL/HDL Ratio: 4
Triglycerides: 168 mg/dL — ABNORMAL HIGH (ref 0.0–149.0)
VLDL: 33.6 mg/dL (ref 0.0–40.0)

## 2023-02-26 LAB — MICROALBUMIN / CREATININE URINE RATIO
Creatinine,U: 92.2 mg/dL
Microalb Creat Ratio: 1.7 mg/g (ref 0.0–30.0)
Microalb, Ur: 1.6 mg/dL (ref 0.0–1.9)

## 2023-02-26 LAB — PSA: PSA: 0.68 ng/mL (ref 0.10–4.00)

## 2023-02-26 LAB — TSH: TSH: 1.2 u[IU]/mL (ref 0.35–5.50)

## 2023-02-26 MED ORDER — RED YEAST RICE 600 MG PO CAPS: 1.0000 | ORAL_CAPSULE | Freq: Every day | ORAL | Status: DC

## 2023-02-26 MED ORDER — ICOSAPENT ETHYL 0.5 G PO CAPS: 4.0000 | ORAL_CAPSULE | Freq: Two times a day (BID) | ORAL | 3 refills | Status: DC

## 2023-02-26 MED ORDER — MAGNESIUM CHLORIDE 64 MG PO TBEC: 1.0000 | DELAYED_RELEASE_TABLET | Freq: Two times a day (BID) | ORAL | Status: DC

## 2023-02-26 NOTE — Assessment & Plan Note (Signed)
Continue with cholecalciferol 1000 Units daily.

## 2023-02-26 NOTE — Addendum Note (Signed)
Addended by: Josephine Igo B on: 02/26/2023 01:04 PM   Modules accepted: Orders

## 2023-02-26 NOTE — Progress Notes (Signed)
Assessment  Assessment/Plan:   Problem List Items Addressed This Visit       Cardiovascular and Mediastinum   Primary hypertension - Primary    Continue monitoring blood pressure and lifestyle modifications including diet and exercise.       Relevant Medications   magnesium chloride (SLOW-MAG) 64 MG TBEC SR tablet   Icosapent Ethyl 0.5 g CAPS   Other Relevant Orders   TSH   Lipid panel   Microalbumin / creatinine urine ratio   Urinalysis, Routine w reflex microscopic   CBC with Differential/Platelet   Comprehensive metabolic panel     Musculoskeletal and Integument   Acne vulgaris    Given the patient's history of using clindamycin-benzoyl peroxide without satisfactory results and self-discontinuation.  Minocycline maintenance to continue with close monitoring for any adverse effects.  Gentle skin care advised.        Other   Screen for colon cancer     Encouraged the use of Fecal DNA (Cologuard) testing as patient prefers non-invasive screening. To be completed every 3 years barring positive results or symptom development.      Vitamin D deficiency    Continue with cholecalciferol 1000 Units daily.       Relevant Orders   Vitamin D 1,25 dihydroxy   Hyperlipidemia    The patient, given his history of hyperlipidemia, continues to be resistant to trialing statin although has been taking fish oil and intends to manage with lifestyle modifications.  Consider restarting statin medication if persistently elevated      Relevant Medications   magnesium chloride (SLOW-MAG) 64 MG TBEC SR tablet   Icosapent Ethyl 0.5 g CAPS   Other Relevant Orders   TSH   Lipid panel   Microalbumin / creatinine urine ratio   Urinalysis, Routine w reflex microscopic   CBC with Differential/Platelet   Comprehensive metabolic panel   Statin medication declined by patient   Other Visit Diagnoses     Screening for malignant neoplasm of prostate       Relevant Orders   PSA   Encounter  for well adult exam without abnormal findings           Medications Discontinued During This Encounter  Medication Reason   magnesium chloride (SLOW-MAG) 64 MG TBEC SR tablet    clindamycin-benzoyl peroxide (BENZACLIN) gel    Icosapent Ethyl 0.5 g CAPS    gentamicin ointment (GARAMYCIN) 0.1 %    Omega-3 Fatty Acids (FISH OIL PO)     Patient Counseling(The following topics were reviewed and/or handout was given):  -Nutrition: Stressed importance of moderation in sodium/caffeine intake, saturated fat and cholesterol, caloric balance, sufficient intake of fresh fruits, vegetables, and fiber.  -Stressed the importance of regular exercise.   -Substance Abuse: Discussed cessation/primary prevention of tobacco, alcohol, or other drug use; driving or other dangerous activities under the influence; availability of treatment for abuse.   -Injury prevention: Discussed safety belts, safety helmets, smoke detector, smoking near bedding or upholstery.   -Sexuality: Discussed sexually transmitted diseases, partner selection, use of condoms, avoidance of unintended pregnancy and contraceptive alternatives.   -Dental health: Discussed importance of regular tooth brushing, flossing, and dental visits.  -Health maintenance and immunizations reviewed. Please refer to Health maintenance section.  Return in about 6 months (around 08/29/2023) for BP, cholestrol.       Subjective:  Chief complaint Encounter date: 02/26/2023  Chief Complaint  Patient presents with   Medical Management of Chronic Issues    B/p f/u.  Fasting    Nathan Kelly is a 57 y.o. male who presents today for his annual comprehensive physical exam.     Lifestyle Diet: Patient likes to eat meat, but he is working to try to reduce this due to cholesterol levels otherwise feels that he is trying to eat fruits vegetables Exercise: Does cardio and push-ups multiple times a week      No data to display            05/30/2022     2:38 PM 03/28/2022    9:07 AM 12/14/2020    9:22 AM 10/15/2020    9:43 AM 03/12/2018    9:31 AM 03/07/2017    9:32 AM  Depression screen PHQ 2/9  Decreased Interest 0 0 0 0 0 0  Down, Depressed, Hopeless 0 0 0 0 0 0  PHQ - 2 Score 0 0 0 0 0 0    Health Maintenance Due  Topic Date Due   Zoster Vaccines- Shingrix (1 of 2) Never done   Fecal DNA (Cologuard)  04/18/2019     Dental: Gets dental cleanings twice a year  Vision: Has not seen an eye doctor in several years, although no vision complaint.  Encouraged to follow-up for routine eye health assessment.    ROS: Denies chest pain or shortness of breath, otherwise all systems reviewed and are negative  PMH:  The following were reviewed and entered/updated in epic: Past Medical History:  Diagnosis Date   Eczema 06/27/2022   Hyperglycemia 03/28/2022   Hyperlipidemia    Paronychia of finger of right hand 12/03/2022    Patient Active Problem List   Diagnosis Date Noted   Statin medication declined by patient 02/26/2023   Primary hypertension 05/30/2022   Vitamin D deficiency 03/28/2022   Hyperlipidemia 03/28/2022   Acne vulgaris 03/28/2022   Overweight (BMI 25.0-29.9) 03/28/2022   Screen for colon cancer 12/29/2015    No past surgical history on file.  Family History  Problem Relation Age of Onset   Diabetes Mother    Hypertension Mother    Hypertension Daughter     Medications- reviewed and updated Outpatient Medications Prior to Visit  Medication Sig Dispense Refill   cholecalciferol (VITAMIN D3) 25 MCG (1000 UNIT) tablet Take 1,000 Units by mouth daily.     minocycline (MINOCIN) 100 MG capsule TAKE 1 CAPSULE BY MOUTH TWICE A DAY 60 capsule 2   gentamicin ointment (GARAMYCIN) 0.1 % Apply 1 Application topically 3 (three) times daily. 15 g 0   Omega-3 Fatty Acids (FISH OIL PO) Take by mouth.     clindamycin-benzoyl peroxide (BENZACLIN) gel Apply topically 2 (two) times daily. (Patient not taking: Reported on  02/26/2023) 50 g 3   Icosapent Ethyl 0.5 g CAPS TAKE 4 CAPSULES BY MOUTH 2 (TWO) TIMES DAILY. (Patient not taking: Reported on 02/26/2023) 240 capsule 0   magnesium chloride (SLOW-MAG) 64 MG TBEC SR tablet Take 1 tablet (64 mg total) by mouth 2 (two) times daily. (Patient not taking: Reported on 02/26/2023) 60 tablet    No facility-administered medications prior to visit.    No Known Allergies  Social History   Socioeconomic History   Marital status: Married    Spouse name: Macon Large   Number of children: 3   Years of education: Not on file   Highest education level: Not on file  Occupational History   Occupation: Medical illustrator    Comment: [employer]  Tobacco Use   Smoking status: Former  Packs/day: 0.50    Years: 30.00    Additional pack years: 0.00    Total pack years: 15.00    Types: Cigarettes    Quit date: 12/11/2008    Years since quitting: 14.2   Smokeless tobacco: Never  Vaping Use   Vaping Use: Never used  Substance and Sexual Activity   Alcohol use: No    Alcohol/week: 0.0 standard drinks of alcohol   Drug use: No   Sexual activity: Yes    Partners: Female    Birth control/protection: Surgical  Other Topics Concern   Not on file  Social History Narrative   Moved to [place] in 1989 from [place]   Lives with wife and 3 daughters      Wears seatbelt 100%   Guns in home - yes secured   Social Determinants of Health   Financial Resource Strain: Not on file  Food Insecurity: Not on file  Transportation Needs: Not on file  Physical Activity: Not on file  Stress: Not on file  Social Connections: Not on file        Objective:  Physical Exam: BP 122/78 (BP Location: Left Arm, Patient Position: Sitting, Cuff Size: Large)   Pulse 62   Temp (!) 97.5 F (36.4 C) (Temporal)   Wt 179 lb 3.2 oz (81.3 kg)   SpO2 100%   BMI 25.71 kg/m   Body mass index is 25.71 kg/m. Wt Readings from Last 3 Encounters:  02/26/23 179 lb 3.2 oz (81.3 kg)  11/28/22 179 lb  9.6 oz (81.5 kg)  08/28/22 179 lb 6.4 oz (81.4 kg)    Physical Exam Constitutional:      General: He is not in acute distress.    Appearance: Normal appearance. He is not ill-appearing or toxic-appearing.  HENT:     Head: Normocephalic.     Right Ear: Tympanic membrane, ear canal and external ear normal. There is no impacted cerumen.     Left Ear: Tympanic membrane, ear canal and external ear normal. There is no impacted cerumen.     Nose: Nose normal. No congestion.     Mouth/Throat:     Mouth: Mucous membranes are moist.     Pharynx: Oropharynx is clear. No oropharyngeal exudate.  Eyes:     General: No scleral icterus.       Right eye: No discharge.        Left eye: No discharge.     Conjunctiva/sclera: Conjunctivae normal.     Pupils: Pupils are equal, round, and reactive to light.  Cardiovascular:     Rate and Rhythm: Normal rate and regular rhythm.     Pulses: Normal pulses.     Heart sounds: Normal heart sounds.  Pulmonary:     Effort: Pulmonary effort is normal. No respiratory distress.     Breath sounds: Normal breath sounds.  Abdominal:     General: Abdomen is flat. Bowel sounds are normal.     Palpations: Abdomen is soft.  Musculoskeletal:        General: Normal range of motion.     Cervical back: Normal range of motion.  Lymphadenopathy:     Cervical: No cervical adenopathy.  Skin:    General: Skin is warm and dry.     Findings: Acne (Face and back) present. No rash.  Neurological:     General: No focal deficit present.     Mental Status: He is alert and oriented to person, place, and time. Mental status is at baseline.  Psychiatric:        Mood and Affect: Mood normal.        Behavior: Behavior normal.        Thought Content: Thought content normal.        Judgment: Judgment normal.          At today's visit, we discussed treatment options, associated risk and benefits, and engage in counseling as needed.  Additionally the following were reviewed:  Past medical records, past medical and surgical history, family and social background, as well as relevant laboratory results, imaging findings, and specialty notes, where applicable.  This message was generated using dictation software, and as a result, it may contain unintentional typos or errors.  Nevertheless, extensive effort was made to accurately convey at the pertinent aspects of the patient visit.    There may have been are other unrelated non-urgent complaints, but due to the busy schedule and the amount of time already spent with him, time does not permit to address these issues at today's visit. Another appointment may have or has been requested to review these additional issues.   Marny Lowenstein, MD, MS

## 2023-02-26 NOTE — Assessment & Plan Note (Addendum)
The patient, given his history of hyperlipidemia, continues to be resistant to trialing statin although has been taking fish oil and intends to manage with lifestyle modifications.  Consider restarting statin medication if persistently elevated

## 2023-02-26 NOTE — Assessment & Plan Note (Signed)
Encouraged the use of Fecal DNA (Cologuard) testing as patient prefers non-invasive screening. To be completed every 3 years barring positive results or symptom development.

## 2023-02-26 NOTE — Telephone Encounter (Signed)
Patient's last cologuard was negative in 04/2016. There's no other testing through [facility] listed.

## 2023-02-26 NOTE — Patient Instructions (Signed)
Continue working on [employer] and exercise.  We are checking labs as discussed.

## 2023-02-26 NOTE — Assessment & Plan Note (Signed)
Given the patient's history of using clindamycin-benzoyl peroxide without satisfactory results and self-discontinuation.  Minocycline maintenance to continue with close monitoring for any adverse effects.  Gentle skin care advised.

## 2023-02-26 NOTE — Addendum Note (Signed)
Addended by: Josephine Igo B on: 02/26/2023 03:12 PM   Modules accepted: Orders

## 2023-02-26 NOTE — Assessment & Plan Note (Signed)
Continue monitoring blood pressure and lifestyle modifications including diet and exercise.

## 2023-02-27 ENCOUNTER — Encounter: Payer: Self-pay | Admitting: Family Medicine

## 2023-02-27 NOTE — Telephone Encounter (Signed)
Spoke with patient and advised him that fecal occult testing and cologuard are two separate tests. I advised him that provider has sent out cologuard for him. He verbalized understanding.

## 2023-03-02 LAB — VITAMIN D 1,25 DIHYDROXY
Vitamin D 1, 25 (OH)2 Total: 31 pg/mL (ref 18–72)
Vitamin D2 1, 25 (OH)2: <8 pg/mL
Vitamin D3 1, 25 (OH)2: 31 pg/mL

## 2023-03-28 DIAGNOSIS — Z1211 Encounter for screening for malignant neoplasm of colon: Secondary | ICD-10-CM | POA: Diagnosis not present

## 2023-04-05 LAB — COLOGUARD: COLOGUARD: NEGATIVE

## 2023-04-23 DIAGNOSIS — M545 Low back pain, unspecified: Secondary | ICD-10-CM | POA: Diagnosis not present

## 2023-05-04 DIAGNOSIS — S335XXD Sprain of ligaments of lumbar spine, subsequent encounter: Secondary | ICD-10-CM | POA: Diagnosis not present

## 2023-05-09 DIAGNOSIS — S335XXD Sprain of ligaments of lumbar spine, subsequent encounter: Secondary | ICD-10-CM | POA: Diagnosis not present

## 2023-05-29 DIAGNOSIS — M5136 Other intervertebral disc degeneration, lumbar region: Secondary | ICD-10-CM | POA: Diagnosis not present

## 2023-06-13 DIAGNOSIS — S39012D Strain of muscle, fascia and tendon of lower back, subsequent encounter: Secondary | ICD-10-CM | POA: Diagnosis not present

## 2023-06-13 DIAGNOSIS — M5459 Other low back pain: Secondary | ICD-10-CM | POA: Diagnosis not present

## 2023-07-26 ENCOUNTER — Encounter (INDEPENDENT_AMBULATORY_CARE_PROVIDER_SITE_OTHER): Payer: Self-pay

## 2023-07-26 ENCOUNTER — Telehealth: Payer: Self-pay | Admitting: Family Medicine

## 2023-07-26 DIAGNOSIS — L309 Dermatitis, unspecified: Secondary | ICD-10-CM

## 2023-07-26 NOTE — Telephone Encounter (Signed)
Pt would like for you to call him . Pt said the hydrocortisone   need to be switch to cream.

## 2023-07-27 MED ORDER — HYDROCORTISONE 2.5 % EX CREA: TOPICAL_CREAM | Freq: Two times a day (BID) | CUTANEOUS | 0 refills | Status: DC

## 2023-07-27 MED ORDER — HYDROCORTISONE 2.5 % EX OINT: TOPICAL_OINTMENT | CUTANEOUS | 3 refills | Status: DC

## 2023-07-27 NOTE — Telephone Encounter (Signed)
Patient called back in for the hydrocortisone cream and not the ointment. The ointment is too greasy and transfers to his clothes. Can you please send in the hydrocortisone 2.5% cream for patient.

## 2023-07-27 NOTE — Addendum Note (Signed)
Addended by: Fanny Bien B on: 07/27/2023 05:17 PM   Modules accepted: Orders

## 2023-08-18 ENCOUNTER — Other Ambulatory Visit: Payer: Self-pay | Admitting: Family Medicine

## 2023-09-03 ENCOUNTER — Ambulatory Visit: Payer: 59 | Admitting: Family Medicine

## 2023-09-03 ENCOUNTER — Encounter: Payer: Self-pay | Admitting: Family Medicine

## 2023-09-03 VITALS — BP 126/74 | HR 87 | Temp 98.0°F | Wt 175.6 lb

## 2023-09-03 DIAGNOSIS — M5441 Lumbago with sciatica, right side: Secondary | ICD-10-CM | POA: Diagnosis not present

## 2023-09-03 DIAGNOSIS — Z125 Encounter for screening for malignant neoplasm of prostate: Secondary | ICD-10-CM | POA: Diagnosis not present

## 2023-09-03 DIAGNOSIS — I7121 Aneurysm of the ascending aorta, without rupture: Secondary | ICD-10-CM | POA: Diagnosis not present

## 2023-09-03 DIAGNOSIS — E559 Vitamin D deficiency, unspecified: Secondary | ICD-10-CM

## 2023-09-03 DIAGNOSIS — I10 Essential (primary) hypertension: Secondary | ICD-10-CM | POA: Diagnosis not present

## 2023-09-03 DIAGNOSIS — E782 Mixed hyperlipidemia: Secondary | ICD-10-CM | POA: Diagnosis not present

## 2023-09-03 DIAGNOSIS — F418 Other specified anxiety disorders: Secondary | ICD-10-CM | POA: Diagnosis not present

## 2023-09-03 DIAGNOSIS — G8929 Other chronic pain: Secondary | ICD-10-CM

## 2023-09-03 DIAGNOSIS — L700 Acne vulgaris: Secondary | ICD-10-CM

## 2023-09-03 LAB — LIPID PANEL
Cholesterol: 198 mg/dL (ref 0–200)
HDL: 53.4 mg/dL (ref >39.00)
LDL Cholesterol: 118 mg/dL — ABNORMAL HIGH (ref 0–99)
NonHDL: 144.73
Total CHOL/HDL Ratio: 4
Triglycerides: 133 mg/dL (ref 0.0–149.0)
VLDL: 26.6 mg/dL (ref 0.0–40.0)

## 2023-09-03 MED ORDER — ALPRAZOLAM 0.5 MG PO TABS
0.5000 mg | ORAL_TABLET | ORAL | 0 refills | Status: DC | PRN
Start: 2023-09-03 — End: 2023-11-12

## 2023-09-03 NOTE — Patient Instructions (Signed)
Continue taking fish oil to help manage your triglyceride levels. Consider getting the shingles shot to boost your immunity against the virus. If you experience anxiety during the MRI, you can take the prescribed alprazolam an hour before the scan, but ensure someone is available to drive you. Monitor your cholesterol levels and consider a coronary artery scoring scan to assess your heart disease risk. Maintain your healthy diet and exercise routine to support weight and blood pressure management.

## 2023-09-03 NOTE — Progress Notes (Unsigned)
Assessment/Plan:   Problem List Items Addressed This Visit   None   There are no discontinued medications.  No follow-ups on file.    Subjective:   Encounter date: 09/03/2023  Nathan Kelly is a 57 y.o. male who has Screen for colon cancer; Vitamin D deficiency; Hyperlipidemia; Acne vulgaris; Overweight (BMI 25.0-29.9); Primary hypertension; Eczema; and Statin medication declined by patient on their problem list..   He  has a past medical history of Eczema (06/27/2022), Hyperglycemia (03/28/2022), Hyperlipidemia, and Paronychia of finger of right hand (12/03/2022).Marland Kitchen   He presents with chief complaint of Medical Management of Chronic Issues (6 month follow up. Fasting. Would like to discuss shingles vaccines) .   HPI:   ROS  No past surgical history on file.  Outpatient Medications Prior to Visit  Medication Sig Dispense Refill  . cholecalciferol (VITAMIN D3) 25 MCG (1000 UNIT) tablet Take 1,000 Units by mouth daily.    . hydrocortisone 2.5 % cream APPLY TOPICALLY TWICE A DAY 28 g 0  . Icosapent Ethyl 0.5 g CAPS Take 4 capsules (2 g total) by mouth 2 (two) times daily. 720 capsule 3  . magnesium chloride (SLOW-MAG) 64 MG TBEC SR tablet Take 1 tablet (64 mg total) by mouth 2 (two) times daily. (Patient not taking: Reported on 09/03/2023) 60 tablet   . minocycline (MINOCIN) 100 MG capsule TAKE 1 CAPSULE BY MOUTH TWICE A DAY (Patient not taking: Reported on 09/03/2023) 60 capsule 2  . Red Yeast Rice 600 MG CAPS Take 1 capsule (600 mg total) by mouth daily. (Patient not taking: Reported on 09/03/2023)     No facility-administered medications prior to visit.    Family History  Problem Relation Age of Onset  . Diabetes Mother   . Hypertension Mother   . Hypertension Daughter     Social History   Socioeconomic History  . Marital status: Married    Spouse name: [name]  . Number of children: 3  . Years of education: Not on file  . Highest education level: Not on file   Occupational History  . Occupation: Advertising account planner    CommentEngineer, mining  Tobacco Use  . Smoking status: Former    Current packs/day: 0.00    Average packs/day: 0.5 packs/day for 30.0 years (15.0 ttl pk-yrs)    Types: Cigarettes    Start date: 12/11/1978    Quit date: 12/11/2008    Years since quitting: 14.7  . Smokeless tobacco: Never  Vaping Use  . Vaping status: Never Used  Substance and Sexual Activity  . Alcohol use: No    Alcohol/week: 0.0 standard drinks of alcohol  . Drug use: No  . Sexual activity: Yes    Partners: Female    Birth control/protection: Surgical  Other Topics Concern  . Not on file  Social History Narrative   Moved to [place] in 1989 from [place]   Lives with wife and 3 daughters      Wears seatbelt 100%   Guns in home - yes secured   Social Determinants of Health   Financial Resource Strain: Not on file  Food Insecurity: Not on file  Transportation Needs: Not on file  Physical Activity: Not on file  Stress: Not on file  Social Connections: Not on file  Intimate Partner Violence: Not on file  Objective:  Physical Exam: BP 126/74 (BP Location: Left Arm, Patient Position: Sitting, Cuff Size: Large)   Pulse 87   Temp 98 F (36.7 C) (Temporal)   Wt 175 lb 9.6 oz (79.7 kg)   SpO2 99%   BMI 25.20 kg/m   Wt Readings from Last 3 Encounters:  09/03/23 175 lb 9.6 oz (79.7 kg)  02/26/23 179 lb 3.2 oz (81.3 kg)  11/28/22 179 lb 9.6 oz (81.5 kg)     Physical Exam  No results found.  No results found for this or any previous visit (from the past 2160 hour(s)).      Garner Nash, MD, MS

## 2023-09-05 ENCOUNTER — Ambulatory Visit (HOSPITAL_BASED_OUTPATIENT_CLINIC_OR_DEPARTMENT_OTHER)
Admission: RE | Admit: 2023-09-05 | Discharge: 2023-09-05 | Disposition: A | Discharge status: Home / Self Care | Payer: 59 | Source: Ambulatory Visit | Attending: Family Medicine | Admitting: Family Medicine

## 2023-09-05 DIAGNOSIS — E782 Mixed hyperlipidemia: Secondary | ICD-10-CM | POA: Insufficient documentation

## 2023-09-05 NOTE — Assessment & Plan Note (Signed)
Reevaluate lipid management given discontinuation of icosapent ethyl. Emphasize lifestyle modifications and consider statin therapy based on latest lipid results. Further stratify with CT cardiac scoring

## 2023-09-05 NOTE — Assessment & Plan Note (Signed)
Continue with cholecalciferol 1000 Units daily.  Recheck levels

## 2023-09-05 NOTE — Assessment & Plan Note (Signed)
One-time alprazolam for MR scan based anxiety

## 2023-09-05 NOTE — Assessment & Plan Note (Signed)
Continue monitoring blood pressure and lifestyle modifications including diet and exercise.  Risk stratification labs as ordered

## 2023-09-10 MED ORDER — ROSUVASTATIN CALCIUM 10 MG PO TABS
10.0000 mg | ORAL_TABLET | Freq: Every day | ORAL | 3 refills | Status: DC
Start: 2023-09-10 — End: 2024-01-01

## 2023-09-10 NOTE — Addendum Note (Signed)
Addended by: [name] on: 09/10/2023 04:58 PM   Modules accepted: Orders

## 2023-09-17 ENCOUNTER — Telehealth: Payer: Self-pay | Admitting: Family Medicine

## 2023-09-17 DIAGNOSIS — I7121 Aneurysm of the ascending aorta, without rupture: Secondary | ICD-10-CM | POA: Insufficient documentation

## 2023-09-17 NOTE — Telephone Encounter (Signed)
Advised patient of Dr. Carollee Massed note on CT cardiac scoring.   "Scan shows some build up of plaque and shows increased for heart disease. Recommend starting rosuvastatin for management. Reevaluation of cardiac imaging showed mild aneurysm of the aorta.  The aorta is the main blood vessel from the heart to the rest of the body. Aneurysm, also known as dilation, of the aorta increases risk for rupture, a potentially deadly scenario.  Given this, I recommend referral to vascular surgery for further assessment and discussion around ongoing surveillance to monitor for potential widening or rupture."   I provided patient with referral contact below and he verbalized understanding.  September 17, 2023       Nathan Kelly [address] [place] [address]            Dear Nathan Kelly:   After careful review of the medical information, your referral has been approved. See below for additional details. If you have questions regarding why this referral was placed, please contact the referring provider.     You must be actively enrolled with your insurance plan for coverage of the authorized service.  If you have any questions regarding your coverage, please contact your insurance carrier directly.   If you do not already have an appointment, please call [phone] to make an appointment.              Information for Referral #: [id number]   Diagnoses:   I71.21 (ICD-10-CM) - Aneurysm of ascending aorta without rupture [facility])   Procedures: [id number] - AMB REFERRAL TO VASCULAR SURGERY/CLINIC Authorization #:     Referring Provider Information [name] [facility] [phone] Referring To Provider Information [facility] [address] [place] [address] [phone]   Referral Start Date: 09/17/2023 Referral End Date: 09/16/2024

## 2023-09-17 NOTE — Addendum Note (Signed)
Addended by: Fanny Bien B on: 09/17/2023 12:09 PM   Modules accepted: Orders

## 2023-09-17 NOTE — Telephone Encounter (Signed)
Returned your call at 4:45 please call again.

## 2023-09-17 NOTE — Telephone Encounter (Signed)
Cd cardiac results what to do next/ discuss a referral. Pt said can it be my chart video

## 2023-09-18 NOTE — Addendum Note (Signed)
Addended by: Fanny Bien B on: 09/18/2023 04:53 PM   Modules accepted: Orders

## 2023-09-18 NOTE — Addendum Note (Signed)
Addended by: Fanny Bien B on: 09/18/2023 12:15 PM   Modules accepted: Orders

## 2023-09-18 NOTE — Progress Notes (Signed)
Referral updated to [facility] consult over vascular surgery for ascending aortic aneurysm.

## 2023-09-19 ENCOUNTER — Telehealth: Payer: Self-pay

## 2023-09-19 NOTE — Telephone Encounter (Signed)
Pt called in and stated that the [facility] location isn't in his network for [employer]. Is there another location we can send him to?

## 2023-09-20 ENCOUNTER — Other Ambulatory Visit: Payer: Self-pay | Admitting: Family Medicine

## 2023-09-20 DIAGNOSIS — I7121 Aneurysm of the ascending aorta, without rupture: Secondary | ICD-10-CM

## 2023-09-20 DIAGNOSIS — N2889 Other specified disorders of kidney and ureter: Secondary | ICD-10-CM

## 2023-09-21 ENCOUNTER — Telehealth: Payer: Self-pay | Admitting: Family Medicine

## 2023-09-21 NOTE — Telephone Encounter (Signed)
Clinicals faxed. Pending approval

## 2023-09-21 NOTE — Telephone Encounter (Signed)
Advised pt per Roanna Raider, Cannady's note below. "The CT surgeons require a CTA Chest for the DX of TAA. Once the scan is completed I will be able to schedule the patient for a consult.  Thanks for your help)   Instead of the CTA Chest being done he sent pt to [facility]". Pt verbalized understanding.

## 2023-09-21 NOTE — Addendum Note (Signed)
Addended by: Fanny Bien B on: 09/21/2023 02:39 PM   Modules accepted: Orders

## 2023-09-21 NOTE — Telephone Encounter (Signed)
Pt is questioning why he Is needing CT ANGIO CHEST AORTA W/CM & OR WO/CM (Order [id number]) the order was placed yesterday 09/20/23. He just had another one recently. Please advise pt at [phone]

## 2023-09-24 ENCOUNTER — Other Ambulatory Visit: Payer: Self-pay | Admitting: Orthopedic Surgery

## 2023-09-24 DIAGNOSIS — M545 Low back pain, unspecified: Secondary | ICD-10-CM

## 2023-09-25 ENCOUNTER — Ambulatory Visit
Admission: RE | Admit: 2023-09-25 | Discharge: 2023-09-25 | Disposition: A | Discharge status: Home / Self Care | Payer: 59 | Source: Ambulatory Visit | Attending: Family Medicine | Admitting: Family Medicine

## 2023-09-25 DIAGNOSIS — I7121 Aneurysm of the ascending aorta, without rupture: Secondary | ICD-10-CM

## 2023-09-25 DIAGNOSIS — I2510 Atherosclerotic heart disease of native coronary artery without angina pectoris: Secondary | ICD-10-CM | POA: Diagnosis not present

## 2023-09-25 MED ORDER — IOPAMIDOL (ISOVUE-370) INJECTION 76%
200.0000 mL | Freq: Once | INTRAVENOUS | Status: AC | PRN
  Administered 2023-09-25: 75 mL via INTRAVENOUS

## 2023-10-01 ENCOUNTER — Telehealth: Payer: Self-pay | Admitting: Family Medicine

## 2023-10-01 NOTE — Telephone Encounter (Signed)
Pt is wanting his MR Abdomen W Wo Contrast (Order [id number]) ordered by Dr Janee Morn on 10/01/23 moved to [facility] for the 11th of November. He is trying to get this done around the same time he's getting his MR LUMBAR done.

## 2023-10-01 NOTE — Addendum Note (Signed)
Addended by: Fanny Bien B on: 10/01/2023 10:32 AM   Modules accepted: Orders

## 2023-10-03 NOTE — Progress Notes (Unsigned)
[address]       [place] [address]             [phone]        Kahekili Gorbett [id number] December 01, 1966  History of Present Illness: Mr. Butchart is a 57 year old male with a past medical history of hypertension and hyperlipidemia. CT cardiac scoring on 09/05/23 showed a 4.1cm ascending thoracic aortic aneurysm. CTA on 09/25/23 showed an aortic root measuring 4.1 cm and an ascending aortic aneurysm measuring 3.9cm. He does/does not have a family history of aortic aneurysmal disease. He admits/denies a personal history of connective tissue disorder.   Today he admits/denies chest pain, chest tightness, SOB, dizziness, LOC   Current Outpatient Medications on File Prior to Visit  Medication Sig Dispense Refill   ALPRAZolam (XANAX) 0.5 MG tablet Take 1 tablet (0.5 mg total) by mouth as needed for anxiety (Take 1 hour before MRI). 1 tablet 0   cholecalciferol (VITAMIN D3) 25 MCG (1000 UNIT) tablet Take 1,000 Units by mouth daily.     hydrocortisone 2.5 % cream APPLY TOPICALLY TWICE A DAY 28 g 0   Icosapent Ethyl 0.5 g CAPS Take 4 capsules (2 g total) by mouth 2 (two) times daily. 720 capsule 3   rosuvastatin (CRESTOR) 10 MG tablet Take 1 tablet (10 mg total) by mouth daily. 90 tablet 3   No current facility-administered medications on file prior to visit.   Vitals:  ROS   Physical Exam  CTA Results:  Narrative & Impression  CLINICAL DATA:  Preoperative planning for thoracic aortic disease. The ascending aorta was measured at 4.1 cm on partial chest CT without contrast for coronary calcium scoring.   EXAM: CT ANGIOGRAPHY CHEST WITH CONTRAST   TECHNIQUE: Multidetector CT imaging of the chest was performed using the standard protocol during bolus administration of intravenous contrast. Multiplanar CT image reconstructions and MIPs were obtained to evaluate the vascular anatomy.   RADIATION DOSE REDUCTION: This exam was performed according to  the departmental dose-optimization program which includes automated exposure control, adjustment of the mA and/or kV according to patient size and/or use of iterative reconstruction technique.   CONTRAST:  75mL ISOVUE-370 IOPAMIDOL (ISOVUE-370) INJECTION 76%   COMPARISON:  Coronary calcium scoring partial chest CT 09/05/2023.   FINDINGS: Cardiovascular: The cardiac size is normal. Calcification is again noted in the proximal LAD coronary artery and not seen in the other major coronary vessels.   There is no pericardial effusion. The pulmonary veins and arteries are normal in caliber. The pulmonary arteries are centrally clear.   There is a mildly tortuous thoracic aorta with scattered traces of calcific plaque in the descending segment.   The aortic root measures 4.1 cm at the sinuses of Valsalva. The ascending aorta maximum caliber is 3.9 cm AP and transverse. It may have been slightly overestimated on the noncontrast CT due to measurement at an obliquity.   There is no aortic dissection, penetrating ulcer or stenosis. The great vessels branch normally and are clear.   The rest of the aorta is within normal caliber limits.   Mediastinum/Nodes: No enlarged mediastinal, hilar, or axillary lymph nodes. Thyroid gland, trachea, and esophagus demonstrate no significant findings.   Lungs/Pleura: Small linear scar-like opacities both lung apices. No consolidation, effusion or pneumothorax. Rest of the lungs are clear.   Upper Abdomen: 4.3 cm Bosniak 1 cyst in the anterior left kidney, Hounsfield density of 14.   1.3 cm Bosniak 2 cyst  in the anterior aspect of the upper right kidney, Hounsfield density is 21.   Additional too small to characterize bilateral renal hypodensities consistent with a Bosniak 2 cysts.   No specific follow-up for these findings is warranted, but there is also a 1.2 cm indeterminate lesion in the posterior upper left kidney, Hounsfield density of 73,  which warrants follow-up MRI without and with contrast for further study.   The liver is steatotic, mildly prominent. No acute upper abdominal findings or adrenal mass.   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 No acute or other significant osseous findings. The ribcage is intact. No chest wall mass.   Review of the MIP images confirms the above findings.   IMPRESSION: 1. 4.1 cm aortic root and 3.9 cm ascending aorta. Recommend annual imaging followup by CTA or MRA. This recommendation follows 2010 ACCF/AHA/AATS/ACR/ASA/SCA/SCAI/ SIR/STS/SVM Guidelines for the Diagnosis and Management of Patients with Thoracic Aortic Disease. Circulation. 2010; 121: R427-C623. Aortic aneurysm NOS (ICD10-I71.9). 2. Minimal calcific plaque in the descending segment, with tortuosity. 3. Coronary artery atherosclerosis, LAD only. 4. 1.2 cm indeterminate lesion in the posterior upper left kidney. MRI without and recommended. Additional Bosniak 1 and 2 cysts. 5. Hepatic steatosis. 6. 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     Electronically Signed   By: Almira Bar M.D.   On: 10/01/2023 02:14    Impression and Plan: Thoracic aortic aneurysm: Mr. Doten presents to the clinic with a 4.1cm aortic root and 3.9cm ascending aortic aneurysm.  Echocardiogram shows a *** valve without evidence of regurgitation.  We discussed the natural history and and risk factors for growth of ascending aortic aneurysms.  We covered the importance of smoking cessation, tight blood pressure control, refraining from lifting heavy objects, and avoiding fluoroquinolones.  The patient is aware of signs and symptoms of aortic dissection and when to present to the emergency department.  We will continue surveillance and a repeat ** was ordered for ***.  Coronary artery atherosclerosis: Followed closely by his PCP  Left kidney lesions: 1.2cm indeterminate  lesion, Bosniak 1 and 2 cysts. PCP has ordered MRI abdomen with and without contrast    Risk Modification:  Statin:  rosuvastatin  Smoking cessation instruction/counseling given:  {CHL AMB [facility] SMOKING CESSATION COUNSELING:20758}  Patient was counseled on importance of Blood Pressure Control.  Despite Medical intervention if the patient notices persistently elevated blood pressure readings.  They are instructed to contact their Primary Care Physician  Please avoid use of Fluoroquinolones as this can potentially increase your risk of Aortic Rupture and/or Dissection  Patient educated on signs and symptoms of Aortic Dissection, handout also provided in AVS  Jenny Reichmann, PA-C 10/03/23

## 2023-10-08 ENCOUNTER — Ambulatory Visit: Payer: 59 | Admitting: Family Medicine

## 2023-10-09 ENCOUNTER — Ambulatory Visit: Payer: 59 | Admitting: Physician Assistant

## 2023-10-09 VITALS — BP 150/99 | HR 76 | Resp 20 | Ht 70.0 in | Wt 175.0 lb

## 2023-10-09 DIAGNOSIS — N2889 Other specified disorders of kidney and ureter: Secondary | ICD-10-CM | POA: Diagnosis not present

## 2023-10-09 DIAGNOSIS — I7121 Aneurysm of the ascending aorta, without rupture: Secondary | ICD-10-CM | POA: Diagnosis not present

## 2023-10-09 NOTE — Patient Instructions (Signed)

## 2023-10-11 ENCOUNTER — Encounter: Payer: Self-pay | Admitting: Family Medicine

## 2023-10-11 ENCOUNTER — Ambulatory Visit: Payer: 59 | Admitting: Family Medicine

## 2023-10-11 VITALS — BP 142/80 | HR 76 | Temp 97.5°F | Wt 179.8 lb

## 2023-10-11 DIAGNOSIS — M5441 Lumbago with sciatica, right side: Secondary | ICD-10-CM | POA: Diagnosis not present

## 2023-10-11 DIAGNOSIS — I10 Essential (primary) hypertension: Secondary | ICD-10-CM

## 2023-10-11 DIAGNOSIS — E782 Mixed hyperlipidemia: Secondary | ICD-10-CM

## 2023-10-11 DIAGNOSIS — N289 Disorder of kidney and ureter, unspecified: Secondary | ICD-10-CM | POA: Insufficient documentation

## 2023-10-11 DIAGNOSIS — F418 Other specified anxiety disorders: Secondary | ICD-10-CM

## 2023-10-11 DIAGNOSIS — I7121 Aneurysm of the ascending aorta, without rupture: Secondary | ICD-10-CM | POA: Diagnosis not present

## 2023-10-11 DIAGNOSIS — N2889 Other specified disorders of kidney and ureter: Secondary | ICD-10-CM

## 2023-10-11 DIAGNOSIS — G8929 Other chronic pain: Secondary | ICD-10-CM

## 2023-10-11 NOTE — Progress Notes (Signed)
Assessment/Plan:   Problem List Items Addressed This Visit       Cardiovascular and Mediastinum   Primary hypertension - Primary    Elevated, potentially exacerbated by anxiety  during a recent dental procedure and new health diagnosis  Plan: Monitor blood pressure at home with new arm cuff Consider starting antihypertensive medication if persistently elevated Educate patient on stress and lifestyle management strategies. Follow-up in 1 month for blood pressure      Aneurysm of ascending aorta without rupture (HCC)    Ascending aortic aneurysm measuring 4.1 cm.  Recently seen by cardiothoracic surgery. Blood pressure goals under 120/80.  LDL under 70.  Plan: Continue follow-up with cardiothoracic surgery for annual imaging for aneurysm size monitoring. Maintain blood pressure under control to prevent expansion. Start rosuvastatin 10 mg daily for cholesterol management.        Nervous and Auditory   Chronic midline low back pain with right-sided sciatica     Genitourinary   Kidney lesion, native, left    Apparently not completed due to scheduling error.  Reordering to assess lesion further.        Other   Hyperlipidemia   Situational anxiety   Other Visit Diagnoses     Other specified disorders of kidney and ureter       Relevant Orders   MR Abdomen W Wo Contrast       There are no discontinued medications.  Return in about 4 weeks (around 11/08/2023) for BP, HLD.    Subjective:   Encounter date: 10/11/2023  Nathan Kelly is a 57 y.o. male who has Screen for colon cancer; Vitamin D deficiency; Hyperlipidemia; Acne vulgaris; Overweight (BMI 25.0-29.9); Primary hypertension; Eczema; Statin medication declined by patient; Chronic midline low back pain with right-sided sciatica; Situational anxiety; Aneurysm of ascending aorta without rupture (HCC); and Kidney lesion, native, left on their problem list..   He  has a past medical history of Eczema (06/27/2022),  Hyperglycemia (03/28/2022), Hyperlipidemia, and Paronychia of finger of right hand (12/03/2022)..   Chief Complaint: Elevated blood pressure and concerns about recent rise during a dental procedure  History of Present Illness:   Hypertension. Patient with a history of lifestyle controlled hypertension, had a recent deep dental cleaning. Reports that after being administered lidocaine shot, they experienced increased nervousness and difficulty breathing with associated elevation in blood pressure ranging from SBP 150-190. Improved later after patient was able to relax. The patient attributes this spike to the bitter taste of the anesthetic possibly swallowed during the procedure. They continued to monitor blood pressure at home, reporting SBP ranging up to 160 using home wrist blood pressure cuff. It is reading about 20 mmHg higher than BP measurement in office.  He denies chest pain or shortness of breath.  Thoracic Aortic Aneurysm and Cholesterol: Recent diagnosis of thoracic aortic aneurysm measuring 4.1 cm.  Recently seen by cardiothoracic surgery.  He has been advised to closely manage blood pressure and cholesterol to prevent complications related to the aneurysm.  The significance of maintaining a blood pressure below 120/80 to prevent rupture of the aneurysm has been discussed. The patient was previously prescribed rosuvastatin 10 mg to manage cholesterol, atherosclerosis, and aneurysmal management. He was initially skeptical of starting this medication.  Recent lipid panel with 118 LDL, and an HDL of 53. The target LDL levels were discussed, emphasizing the need for tighter control given the aneurysm.  Kidney Lesion: During the same period as the aneurysm discovery, and incidental lesion of the  upper left kidney.  It was 1.2 cm with indeterminate appearance.  Further imaging was recommended by radiology to rule out malignancy.  An MRI of the abdomen was scheduled but not yet completed due to  scheduling issues. The urgency of investigating the kidney lesion to rule out cancer was reiterated.    No past surgical history on file.  Outpatient Medications Prior to Visit  Medication Sig Dispense Refill   cholecalciferol (VITAMIN D3) 25 MCG (1000 UNIT) tablet Take 1,000 Units by mouth daily.     hydrocortisone 2.5 % cream APPLY TOPICALLY TWICE A DAY 28 g 0   Icosapent Ethyl 0.5 g CAPS Take 4 capsules (2 g total) by mouth 2 (two) times daily. 720 capsule 3   ALPRAZolam (XANAX) 0.5 MG tablet Take 1 tablet (0.5 mg total) by mouth as needed for anxiety (Take 1 hour before MRI). (Patient not taking: Reported on 10/11/2023) 1 tablet 0   rosuvastatin (CRESTOR) 10 MG tablet Take 1 tablet (10 mg total) by mouth daily. (Patient not taking: Reported on 10/11/2023) 90 tablet 3   No facility-administered medications prior to visit.    Family History  Problem Relation Age of Onset   Diabetes Mother    Hypertension Mother    Hypertension Daughter     Social History   Socioeconomic History   Marital status: Married    Spouse name: [name]   Number of children: 3   Years of education: Not on file   Highest education level: Associate degree: academic program  Occupational History   Occupation: Advertising account planner    Comment: [employer]  Tobacco Use   Smoking status: Former    Current packs/day: 0.00    Average packs/day: 0.5 packs/day for 30.0 years (15.0 ttl pk-yrs)    Types: Cigarettes    Start date: 12/11/1978    Quit date: 12/11/2008    Years since quitting: 14.8   Smokeless tobacco: Never  Vaping Use   Vaping status: Never Used  Substance and Sexual Activity   Alcohol use: No    Alcohol/week: 0.0 standard drinks of alcohol   Drug use: No   Sexual activity: Yes    Partners: Female    Birth control/protection: Surgical  Other Topics Concern   Not on file  Social History Narrative   Moved to [place] in 1989 from [place]   Lives with wife and 3 daughters      Wears seatbelt  100%   Guns in home - yes secured   Social Determinants of Health   Financial Resource Strain: Low Risk  (10/07/2023)   Overall Financial Resource Strain (CARDIA)    Difficulty of Paying Living Expenses: Not hard at all  Food Insecurity: No Food Insecurity (10/07/2023)   Hunger Vital Sign    Worried About Running Out of Food in the Last Year: Never true    Ran Out of Food in the Last Year: Never true  Transportation Needs: No Transportation Needs (10/07/2023)   PRAPARE - Administrator, Civil Service (Medical): No    Lack of Transportation (Non-Medical): No  Physical Activity: Sufficiently Active (10/07/2023)   Exercise Vital Sign    Days of Exercise per Week: 7 days    Minutes of Exercise per Session: 30 min  Stress: No Stress Concern Present (10/07/2023)   [employer] of Occupational Health - Occupational Stress Questionnaire    Feeling of Stress : Not at all  Social Connections: Moderately Integrated (10/07/2023)   Social Connection and Isolation  Panel [NHANES]    Frequency of Communication with Friends and Family: More than three times a week    Frequency of Social Gatherings with Friends and Family: Once a week    Attends Religious Services: More than 4 times per year    Active Member of [employer] or Organizations: No    Attends Engineer, structural: Not on file    Marital Status: Married  Intimate Partner Violence: Unknown (09/03/2023)   Received from [facility]   HITS    Physically Hurt: Not on file    Insult or Talk Down To: Not on file    Threaten Physical Harm: Not on file    Scream or Curse: Not on file                                                                                                  Objective:  Physical Exam: BP (!) 142/80 (BP Location: Left Arm, Patient Position: Sitting, Cuff Size: Large)   Pulse 76   Temp (!) 97.5 F (36.4 C) (Temporal)   Wt 179 lb 12.8 oz (81.6 kg)   SpO2 99%   BMI 25.80 kg/m     Physical  Exam Constitutional:      General: He is not in acute distress.    Appearance: Normal appearance. He is not ill-appearing or toxic-appearing.  HENT:     Head: Normocephalic.     Right Ear: Tympanic membrane, ear canal and external ear normal. There is no impacted cerumen.     Left Ear: Tympanic membrane, ear canal and external ear normal. There is no impacted cerumen.     Nose: Nose normal. No congestion.     Mouth/Throat:     Mouth: Mucous membranes are moist.     Pharynx: Oropharynx is clear. No oropharyngeal exudate.  Eyes:     General: No scleral icterus.       Right eye: No discharge.        Left eye: No discharge.     Conjunctiva/sclera: Conjunctivae normal.     Pupils: Pupils are equal, round, and reactive to light.  Cardiovascular:     Rate and Rhythm: Normal rate and regular rhythm.     Pulses: Normal pulses.     Heart sounds: Normal heart sounds.  Pulmonary:     Effort: Pulmonary effort is normal. No respiratory distress.     Breath sounds: Normal breath sounds.  Abdominal:     General: Abdomen is flat. Bowel sounds are normal.     Palpations: Abdomen is soft.  Musculoskeletal:        General: Normal range of motion.     Cervical back: Normal range of motion.  Lymphadenopathy:     Cervical: No cervical adenopathy.  Skin:    General: Skin is warm and dry.     Findings: Acne (Face and back) present. No rash.  Neurological:     General: No focal deficit present.     Mental Status: He is alert and oriented to person, place, and time. Mental status is at baseline.  Psychiatric:  Mood and Affect: Mood is anxious (especially about recent health diagnosis).        Behavior: Behavior normal.        Thought Content: Thought content normal.        Judgment: Judgment normal.     CT ANGIO CHEST AORTA W/CM & OR WO/CM  Result Date: 10/01/2023 CLINICAL DATA:  Preoperative planning for thoracic aortic disease. The ascending aorta was measured at 4.1 cm on partial  chest CT without contrast for coronary calcium scoring. EXAM: CT ANGIOGRAPHY CHEST WITH CONTRAST TECHNIQUE: Multidetector CT imaging of the chest was performed using the standard protocol during bolus administration of intravenous contrast. Multiplanar CT image reconstructions and MIPs were obtained to evaluate the vascular anatomy. RADIATION DOSE REDUCTION: This exam was performed according to the departmental dose-optimization program which includes automated exposure control, adjustment of the mA and/or kV according to patient size and/or use of iterative reconstruction technique. CONTRAST:  75mL ISOVUE-370 IOPAMIDOL (ISOVUE-370) INJECTION 76% COMPARISON:  Coronary calcium scoring partial chest CT 09/05/2023. FINDINGS: Cardiovascular: The cardiac size is normal. Calcification is again noted in the proximal LAD coronary artery and not seen in the other major coronary vessels. There is no pericardial effusion. The pulmonary veins and arteries are normal in caliber. The pulmonary arteries are centrally clear. There is a mildly tortuous thoracic aorta with scattered traces of calcific plaque in the descending segment. The aortic root measures 4.1 cm at the sinuses of Valsalva. The ascending aorta maximum caliber is 3.9 cm AP and transverse. It may have been slightly overestimated on the noncontrast CT due to measurement at an obliquity. There is no aortic dissection, penetrating ulcer or stenosis. The great vessels branch normally and are clear. The rest of the aorta is within normal caliber limits. Mediastinum/Nodes: No enlarged mediastinal, hilar, or axillary lymph nodes. Thyroid gland, trachea, and esophagus demonstrate no significant findings. Lungs/Pleura: Small linear scar-like opacities both lung apices. No consolidation, effusion or pneumothorax. Rest of the lungs are clear. Upper Abdomen: 4.3 cm Bosniak 1 cyst in the anterior left kidney, Hounsfield density of 14. 1.3 cm Bosniak 2 cyst in the anterior aspect  of the upper right kidney, Hounsfield density is 21. Additional too small to characterize bilateral renal hypodensities consistent with a Bosniak 2 cysts. No specific follow-up for these findings is warranted, but there is also a 1.2 cm indeterminate lesion in the posterior upper left kidney, Hounsfield density of 73, which warrants follow-up MRI without and with contrast for further study. The liver is steatotic, mildly prominent. No acute upper abdominal findings or adrenal mass. 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 No acute or other significant osseous findings. The ribcage is intact. No chest wall mass. Review of the MIP images confirms the above findings. IMPRESSION: 1. 4.1 cm aortic root and 3.9 cm ascending aorta. Recommend annual imaging followup by CTA or MRA. This recommendation follows 2010 ACCF/AHA/AATS/ACR/ASA/SCA/SCAI/ SIR/STS/SVM Guidelines for the Diagnosis and Management of Patients with Thoracic Aortic Disease. Circulation. 2010; 121: Z610-R604. Aortic aneurysm NOS (ICD10-I71.9). 2. Minimal calcific plaque in the descending segment, with tortuosity. 3. Coronary artery atherosclerosis, LAD only. 4. 1.2 cm indeterminate lesion in the posterior upper left kidney. MRI without and recommended. Additional Bosniak 1 and 2 cysts. 5. Hepatic steatosis. 6. 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 Electronically Signed   By: Almira Bar M.D.   On: 10/01/2023 02:14   CT CARDIAC SCORING (SELF PAY ONLY)  Addendum Date: 09/17/2023   ADDENDUM REPORT: 09/17/2023  07:57 EXAM: OVER-READ INTERPRETATION  CT CHEST The following report is an over-read performed by radiologist Dr. Royal Piedra [facility] on 09/17/2023. This over-read does not include interpretation of cardiac or coronary anatomy or pathology. The coronary artery calcium score interpretation by the cardiologist is attached. COMPARISON:  No priors FINDINGS:  Atherosclerotic calcifications in the thoracic aorta with mild ectasia of the ascending thoracic aorta (4.1 cm in diameter). Within the visualized portions of the thorax there are no suspicious appearing pulmonary nodules or masses, there is no acute consolidative airspace disease, no pleural effusions, no pneumothorax and no lymphadenopathy. Visualized portions of the upper abdomen are unremarkable. There are no aggressive appearing lytic or blastic lesions noted in the visualized portions of the skeleton. IMPRESSION: 1.  Aortic Atherosclerosis (ICD10-I70.0). 2. Ectasia of ascending thoracic aorta (4.1 cm in diameter). Recommend annual imaging followup by CTA or MRA. This recommendation follows 2010 ACCF/AHA/AATS/ACR/ASA/SCA/SCAI/SIR/STS/SVM Guidelines for the Diagnosis and Management of Patients with Thoracic Aortic Disease. Circulation. 2010; 121: Z610-R604. Aortic aneurysm NOS (ICD10-I71.9). Electronically Signed   By: Trudie Reed M.D.   On: 09/17/2023 07:57   Result Date: 09/17/2023 CLINICAL DATA:  Cardiovascular disease risk stratification CAD screening, intermediate CAD risk, treadmill candidate EXAM: CT Coronary Calcium Score TECHNIQUE: A gated, non-contrast computed tomography scan of the heart was performed using 3mm slice thickness. Axial images were analyzed on a dedicated workstation. Calcium scoring of the coronary arteries was performed using the Agatston method. FINDINGS: Coronary Calcium Score: Left main: 0 Left anterior descending artery: 131 Left circumflex artery: 0 Right coronary artery: 0 Total: 131 Percentile: 87th Pericardium: Normal. Ascending Aorta: Mild dilation of ascending aorta, measures approximately 42mm at the mid ascending aorta measured in a non-contrast axial plane. Non-cardiac: See separate report from [facility]. IMPRESSION: 1. Coronary calcium score of 131. This was 87th percentile for age-, race-, and sex-matched controls. 2. Mild dilation of ascending aorta,  measures approximately 42mm at the mid ascending aorta measured in a non-contrast axial plane. RECOMMENDATIONS: Coronary artery calcium (CAC) score is a strong predictor of incident coronary heart disease (CHD) and provides predictive information beyond traditional risk factors. CAC scoring is reasonable to use in the decision to withhold, postpone, or initiate statin therapy in intermediate-risk or selected borderline-risk asymptomatic adults (age 25-75 years and LDL-C >=70 to <190 mg/dL) who do not have diabetes or established atherosclerotic cardiovascular disease (ASCVD).* In intermediate-risk (10-year ASCVD risk >=7.5% to <20%) adults or selected borderline-risk (10-year ASCVD risk >=5% to <7.5%) adults in whom a CAC score is measured for the purpose of making a treatment decision the following recommendations have been made: If CAC=0, it is reasonable to withhold statin therapy and reassess in 5 to 10 years, as long as higher risk conditions are absent (diabetes mellitus, family history of premature CHD in first degree relatives (males <55 years; females <65 years), cigarette smoking, or LDL >=190 mg/dL). If CAC is 1 to 99, it is reasonable to initiate statin therapy for patients >=58 years of age. If CAC is >=100 or >=75th percentile, it is reasonable to initiate statin therapy at any age. Cardiology referral should be considered for patients with CAC scores >=400 or >=75th percentile. *2018 AHA/ACC/AACVPR/AAPA/ABC/ACPM/ADA/AGS/APhA/ASPC/NLA/PCNA Guideline on the Management of Blood Cholesterol: A Report of the American College of Cardiology/American Heart Association Task Force on Clinical Practice Guidelines. J Am Coll Cardiol. 2019;73(24):3168-3209. Electronically Signed: By: Weston Brass M.D. On: 09/05/2023 19:55    Recent Results (from the past 2160 hour(s))  Lipid Profile  Status: Abnormal   Collection Time: 09/03/23 10:12 AM  Result Value Ref Range   Cholesterol 198 0 - 200 mg/dL     Comment: ATP III Classification       Desirable:  < 200 mg/dL               Borderline High:  200 - 239 mg/dL          High:  > = 132 mg/dL   Triglycerides 440.1 0.0 - 149.0 mg/dL    Comment: Normal:  <027 mg/dLBorderline High:  150 - 199 mg/dL   HDL 25.36 >64.40 mg/dL   VLDL 34.7 0.0 - 42.5 mg/dL   LDL Cholesterol 956 (H) 0 - 99 mg/dL   Total CHOL/HDL Ratio 4     Comment:                Men          Women1/2 Average Risk     3.4          3.3Average Risk          5.0          4.42X Average Risk          9.6          7.13X Average Risk          15.0          11.0                       NonHDL 144.73     Comment: NOTE:  Non-HDL goal should be 30 mg/dL higher than patient's LDL goal (i.e. LDL goal of < 70 mg/dL, would have non-HDL goal of < 100 mg/dL)        Garner Nash, MD, MS

## 2023-10-11 NOTE — Assessment & Plan Note (Signed)
Elevated, potentially exacerbated by anxiety  during a recent dental procedure and new health diagnosis  Plan: Monitor blood pressure at home with new arm cuff Consider starting antihypertensive medication if persistently elevated Educate patient on stress and lifestyle management strategies. Follow-up in 1 month for blood pressure

## 2023-10-11 NOTE — Assessment & Plan Note (Signed)
Apparently not completed due to scheduling error.  Reordering to assess lesion further.

## 2023-10-11 NOTE — Patient Instructions (Signed)
-  Monitor Blood Pressure: Daily, preferably in the morning using a cuff that goes around your arm, not the wrist. -Start Medication: Begin taking rosuvastatin 10 mg for cholesterol management. -Dietary Changes: Focus on eating more fruits, vegetables, whole grains, lean proteins such as fish and chicken, and reduce red meat intake. -Avoiding Certain Foods: Stay away from fatty foods to help manage cholesterol levels effectively. -Follow-Up Appointment: Schedule and attend a follow-up visit in one month to check cholesterol and blood pressure levels while fasting.

## 2023-10-11 NOTE — Assessment & Plan Note (Signed)
Ascending aortic aneurysm measuring 4.1 cm.  Recently seen by cardiothoracic surgery. Blood pressure goals under 120/80.  LDL under 70.  Plan: Continue follow-up with cardiothoracic surgery for annual imaging for aneurysm size monitoring. Maintain blood pressure under control to prevent expansion. Start rosuvastatin 10 mg daily for cholesterol management.

## 2023-10-14 ENCOUNTER — Encounter: Payer: Self-pay | Admitting: Family Medicine

## 2023-10-22 ENCOUNTER — Ambulatory Visit
Admission: RE | Admit: 2023-10-22 | Discharge: 2023-10-22 | Disposition: A | Discharge status: Home / Self Care | Payer: 59 | Source: Ambulatory Visit | Attending: Orthopedic Surgery | Admitting: Orthopedic Surgery

## 2023-10-22 DIAGNOSIS — M545 Low back pain, unspecified: Secondary | ICD-10-CM

## 2023-10-22 DIAGNOSIS — M48061 Spinal stenosis, lumbar region without neurogenic claudication: Secondary | ICD-10-CM | POA: Diagnosis not present

## 2023-10-25 DIAGNOSIS — M5441 Lumbago with sciatica, right side: Secondary | ICD-10-CM | POA: Diagnosis not present

## 2023-10-25 DIAGNOSIS — M545 Low back pain, unspecified: Secondary | ICD-10-CM | POA: Diagnosis not present

## 2023-10-25 DIAGNOSIS — M5442 Lumbago with sciatica, left side: Secondary | ICD-10-CM | POA: Diagnosis not present

## 2023-10-29 DIAGNOSIS — M5441 Lumbago with sciatica, right side: Secondary | ICD-10-CM | POA: Diagnosis not present

## 2023-10-29 DIAGNOSIS — M9903 Segmental and somatic dysfunction of lumbar region: Secondary | ICD-10-CM | POA: Diagnosis not present

## 2023-10-29 DIAGNOSIS — M9902 Segmental and somatic dysfunction of thoracic region: Secondary | ICD-10-CM | POA: Diagnosis not present

## 2023-10-29 DIAGNOSIS — M9901 Segmental and somatic dysfunction of cervical region: Secondary | ICD-10-CM | POA: Diagnosis not present

## 2023-10-31 DIAGNOSIS — M9901 Segmental and somatic dysfunction of cervical region: Secondary | ICD-10-CM | POA: Diagnosis not present

## 2023-10-31 DIAGNOSIS — M5441 Lumbago with sciatica, right side: Secondary | ICD-10-CM | POA: Diagnosis not present

## 2023-10-31 DIAGNOSIS — M9903 Segmental and somatic dysfunction of lumbar region: Secondary | ICD-10-CM | POA: Diagnosis not present

## 2023-10-31 DIAGNOSIS — M9902 Segmental and somatic dysfunction of thoracic region: Secondary | ICD-10-CM | POA: Diagnosis not present

## 2023-11-05 DIAGNOSIS — M9902 Segmental and somatic dysfunction of thoracic region: Secondary | ICD-10-CM | POA: Diagnosis not present

## 2023-11-05 DIAGNOSIS — M9901 Segmental and somatic dysfunction of cervical region: Secondary | ICD-10-CM | POA: Diagnosis not present

## 2023-11-05 DIAGNOSIS — M9903 Segmental and somatic dysfunction of lumbar region: Secondary | ICD-10-CM | POA: Diagnosis not present

## 2023-11-05 DIAGNOSIS — M5441 Lumbago with sciatica, right side: Secondary | ICD-10-CM | POA: Diagnosis not present

## 2023-11-06 DIAGNOSIS — M9902 Segmental and somatic dysfunction of thoracic region: Secondary | ICD-10-CM | POA: Diagnosis not present

## 2023-11-06 DIAGNOSIS — M5441 Lumbago with sciatica, right side: Secondary | ICD-10-CM | POA: Diagnosis not present

## 2023-11-06 DIAGNOSIS — M9901 Segmental and somatic dysfunction of cervical region: Secondary | ICD-10-CM | POA: Diagnosis not present

## 2023-11-06 DIAGNOSIS — M9903 Segmental and somatic dysfunction of lumbar region: Secondary | ICD-10-CM | POA: Diagnosis not present

## 2023-11-12 ENCOUNTER — Ambulatory Visit: Payer: 59 | Admitting: Family Medicine

## 2023-11-12 ENCOUNTER — Encounter: Payer: Self-pay | Admitting: Family Medicine

## 2023-11-12 VITALS — BP 128/80 | HR 80 | Temp 97.8°F | Wt 176.2 lb

## 2023-11-12 DIAGNOSIS — E782 Mixed hyperlipidemia: Secondary | ICD-10-CM

## 2023-11-12 DIAGNOSIS — E559 Vitamin D deficiency, unspecified: Secondary | ICD-10-CM

## 2023-11-12 DIAGNOSIS — M9903 Segmental and somatic dysfunction of lumbar region: Secondary | ICD-10-CM | POA: Diagnosis not present

## 2023-11-12 DIAGNOSIS — I7121 Aneurysm of the ascending aorta, without rupture: Secondary | ICD-10-CM | POA: Diagnosis not present

## 2023-11-12 DIAGNOSIS — M48061 Spinal stenosis, lumbar region without neurogenic claudication: Secondary | ICD-10-CM

## 2023-11-12 DIAGNOSIS — M5441 Lumbago with sciatica, right side: Secondary | ICD-10-CM | POA: Diagnosis not present

## 2023-11-12 DIAGNOSIS — Z125 Encounter for screening for malignant neoplasm of prostate: Secondary | ICD-10-CM

## 2023-11-12 DIAGNOSIS — I10 Essential (primary) hypertension: Secondary | ICD-10-CM | POA: Diagnosis not present

## 2023-11-12 DIAGNOSIS — M9901 Segmental and somatic dysfunction of cervical region: Secondary | ICD-10-CM | POA: Diagnosis not present

## 2023-11-12 DIAGNOSIS — M9902 Segmental and somatic dysfunction of thoracic region: Secondary | ICD-10-CM | POA: Diagnosis not present

## 2023-11-12 LAB — CBC WITH DIFFERENTIAL/PLATELET
Basophils Absolute: 0 10*3/uL (ref 0.0–0.1)
Basophils Relative: 0.6 % (ref 0.0–3.0)
Eosinophils Absolute: 0.1 10*3/uL (ref 0.0–0.7)
Eosinophils Relative: 2.1 % (ref 0.0–5.0)
HCT: 46.6 % (ref 39.0–52.0)
Hemoglobin: 15.6 g/dL (ref 13.0–17.0)
Lymphocytes Relative: 21.5 % (ref 12.0–46.0)
Lymphs Abs: 1 10*3/uL (ref 0.7–4.0)
MCHC: 33.4 g/dL (ref 30.0–36.0)
MCV: 86.7 fL (ref 78.0–100.0)
Monocytes Absolute: 0.4 10*3/uL (ref 0.1–1.0)
Monocytes Relative: 7.8 % (ref 3.0–12.0)
Neutro Abs: 3.1 10*3/uL (ref 1.4–7.7)
Neutrophils Relative %: 68 % (ref 43.0–77.0)
Platelets: 192 10*3/uL (ref 150.0–400.0)
RBC: 5.38 Mil/uL (ref 4.22–5.81)
RDW: 12.7 % (ref 11.5–15.5)
WBC: 4.6 10*3/uL (ref 4.0–10.5)

## 2023-11-12 LAB — LIPID PANEL
Cholesterol: 145 mg/dL (ref 0–200)
HDL: 47.5 mg/dL (ref >39.00)
LDL Cholesterol: 65 mg/dL (ref 0–99)
NonHDL: 97.22
Total CHOL/HDL Ratio: 3
Triglycerides: 161 mg/dL — ABNORMAL HIGH (ref 0.0–149.0)
VLDL: 32.2 mg/dL (ref 0.0–40.0)

## 2023-11-12 LAB — COMPREHENSIVE METABOLIC PANEL
ALT: 23 U/L (ref 0–53)
AST: 22 U/L (ref 0–37)
Albumin: 4.2 g/dL (ref 3.5–5.2)
Alkaline Phosphatase: 71 U/L (ref 39–117)
BUN: 15 mg/dL (ref 6–23)
CO2: 29 meq/L (ref 19–32)
Calcium: 9.1 mg/dL (ref 8.4–10.5)
Chloride: 103 meq/L (ref 96–112)
Creatinine, Ser: 0.85 mg/dL (ref 0.40–1.50)
GFR: 96.58 mL/min (ref >60.00)
Glucose, Bld: 103 mg/dL — ABNORMAL HIGH (ref 70–99)
Potassium: 4.3 meq/L (ref 3.5–5.1)
Sodium: 139 meq/L (ref 135–145)
Total Bilirubin: 0.7 mg/dL (ref 0.2–1.2)
Total Protein: 7.2 g/dL (ref 6.0–8.3)

## 2023-11-12 LAB — MAGNESIUM: Magnesium: 2.1 mg/dL (ref 1.5–2.5)

## 2023-11-12 LAB — MICROALBUMIN / CREATININE URINE RATIO
Creatinine,U: 104.7 mg/dL
Microalb Creat Ratio: 1.1 mg/g (ref 0.0–30.0)
Microalb, Ur: 1.1 mg/dL (ref 0.0–1.9)

## 2023-11-12 LAB — TSH: TSH: 1.04 u[IU]/mL (ref 0.35–5.50)

## 2023-11-12 LAB — HEMOGLOBIN A1C: Hgb A1c MFr Bld: 6 % (ref 4.6–6.5)

## 2023-11-12 MED ORDER — NEBIVOLOL HCL 2.5 MG PO TABS: 2.5000 mg | ORAL_TABLET | Freq: Every day | ORAL | 3 refills | Status: DC

## 2023-11-12 NOTE — Patient Instructions (Signed)
-   Begin taking the prescribed nebivolol once daily as directed to help manage your blood pressure and prevent the aneurysm from growing. - Continue taking rosuvastatin 10 mg daily to help control your cholesterol levels. - Increase your magnesium glycinate dosage to 360 mg per day, but do not exceed 600 mg daily. - Continue monitoring your blood pressure at home and maintain your exercise routine, adjusting as needed for your back pain. - Attend your upcoming orthopedic appointment to discuss your back pain management and any additional treatments. - Be sure to obtain imaging of your kidneys to further assess the renal masses

## 2023-11-12 NOTE — Addendum Note (Signed)
Addended by: Tonita Phoenix on: 11/12/2023 02:06 PM   Modules accepted: Orders

## 2023-11-12 NOTE — Assessment & Plan Note (Signed)
Elevated, requiring pharmacological intervention.  Plan: Start nebivolol 2.5 mg once daily. Increase magnesium to 360 mg daily, not to exceed 600 mg.  Continue to monitor blood pressure at home. Continue lifestyle modifications and stress management strategies.

## 2023-11-12 NOTE — Addendum Note (Signed)
Addended by: Tonita Phoenix on: 11/12/2023 02:04 PM   Modules accepted: Orders

## 2023-11-12 NOTE — Assessment & Plan Note (Signed)
Persistent, requires further management. Plan: Continue physical therapy and chiropractic care. Follow up with orthopedics appointment as scheduled. Consider referral to neurosurgery if symptoms do not improve. Manage pain with over-the-counter analgesics as needed.

## 2023-11-12 NOTE — Assessment & Plan Note (Signed)
Elevated, currently tolerating rosuvastatin 10 mg well.  Plan: Continue rosuvastatin 10 mg once daily. Discuss dietary changes focusing on reducing saturated fat intake.

## 2023-11-12 NOTE — Assessment & Plan Note (Signed)
Requires close monitoring. BP Goal < 120/ < 70  Plan: Start nebivolol 2.5 mg daily for blood pressure  Continue annual imaging for aneurysm size monitoring. Maintain blood pressure under control to prevent expansion. Continue rosuvastatin 10 mg daily for cholesterol management.

## 2023-11-12 NOTE — Progress Notes (Signed)
Assessment/Plan:   Problem List Items Addressed This Visit       Cardiovascular and Mediastinum   Primary hypertension - Primary    Elevated, requiring pharmacological intervention.  Plan: Start nebivolol 2.5 mg once daily. Increase magnesium to 360 mg daily, not to exceed 600 mg.  Continue to monitor blood pressure at home. Continue lifestyle modifications and stress management strategies.      Relevant Medications   nebivolol (BYSTOLIC) 2.5 MG tablet   Other Relevant Orders   Magnesium   Aneurysm of ascending aorta without rupture (HCC)    Requires close monitoring. BP Goal < 120/ < 70  Plan: Start nebivolol 2.5 mg daily for blood pressure  Continue annual imaging for aneurysm size monitoring. Maintain blood pressure under control to prevent expansion. Continue rosuvastatin 10 mg daily for cholesterol management.      Relevant Medications   nebivolol (BYSTOLIC) 2.5 MG tablet     Other   Vitamin D deficiency   Hyperlipidemia    Elevated, currently tolerating rosuvastatin 10 mg well.  Plan: Continue rosuvastatin 10 mg once daily. Discuss dietary changes focusing on reducing saturated fat intake.      Relevant Medications   nebivolol (BYSTOLIC) 2.5 MG tablet   Spinal stenosis of lumbar region without neurogenic claudication    Persistent, requires further management. Plan: Continue physical therapy and chiropractic care. Follow up with orthopedics appointment as scheduled. Consider referral to neurosurgery if symptoms do not improve. Manage pain with over-the-counter analgesics as needed.      Other Visit Diagnoses     Screening for prostate cancer           Medications Discontinued During This Encounter  Medication Reason   ALPRAZolam (XANAX) 0.5 MG tablet     Return in about 3 months (around 02/10/2024) for HLD, BP, physical (fasting labs).    Subjective:   Encounter date: 11/12/2023  Nathan Kelly is a 57 y.o. male who has Screen for colon  cancer; Vitamin D deficiency; Hyperlipidemia; Acne vulgaris; Overweight (BMI 25.0-29.9); Primary hypertension; Eczema; Statin medication declined by patient; Chronic midline low back pain with right-sided sciatica; Situational anxiety; Aneurysm of ascending aorta without rupture (HCC); Kidney lesion, native, left; and Spinal stenosis of lumbar region without neurogenic claudication on their problem list..   He  has a past medical history of Eczema (06/27/2022), Hyperglycemia (03/28/2022), Hyperlipidemia, and Paronychia of finger of right hand (12/03/2022).Marland Kitchen   He presents with chief complaint of Medical Management of Chronic Issues (4 weeks (around 11/08/2023) for BP, HLD./Fasting) .  CC:  Patient is a male who presents with elevated blood pressure, thoracic aortic aneurysm, kidney lesion concerns, and low back pain due to lumbar spinal stenosis.  HPI:  Elevated Blood Pressure:  Patient reports that his blood pressure readings at home have improved but remain elevated. He notes home systolic readings around 129-135 mmHg systolic in the mornings and 135-138 mmHg after exercise. He mentions that stress, such as from family events, can increase his blood pressure; this morning, his BP was 138 mmHg after becoming upset with his daughter. He is not currently on antihypertensive medications but is committed to lifestyle modifications, including regular exercise (aims to walk at least 30 minutes daily) and dietary changes. He has been drinking beet juice made from raw beets and is taking magnesium glycinate supplements (currently 240 mg/day).   Thoracic Aortic Aneurysm and Cholesterol:  The patient continues to monitor his thoracic aortic aneurysm, previously measured at 3.9 cm - 4.1 cm. He  understands the importance of maintaining optimal blood pressure and cholesterol levels to prevent aneurysm expansion. He is tolerating rosuvastatin 10 mg daily without significant side effects after initial mild  gastrointestinal upset that resolved when taking the medication after breakfast. Dietary modifications include increased intake of salmon and chicken.   Kidney Lesion:  An MRI of the abdomen is scheduled for December to further evaluate the previously identified kidney lesion. The patient acknowledges the importance of investigating this lesion to rule out malignancy.  Low Back Pain due to Lumbar Spinal Stenosis:  The patient reports ongoing low back pain diagnosed as lumbar spinal stenosis via MRI at [facility]. The pain sometimes radiates, triggering sciatica symptoms during activities such as walking and jogging. He has completed several weeks of physical therapy and chiropractic treatment without significant improvement. He is considering consulting a spine specialist for further evaluation. Last night, after performing an unfamiliar exercise, his back pain worsened, causing him to stop walking after 20 minutes due to increased discomfort. Despite the pain, he aims to continue his daily exercise routine.  ROS:   Cardiology: Denies palpitations, chest pain, or shortness of breath.  Respiratory: Denies shortness of breath  Gastrointestinal: No constipation or diarrhea  Genitourinary: Denies urinary frequency  Musculoskeletal: Reports ongoing low back pain due to lumbar spinal stenosis, causing limitations in activities; sciatica symptoms triggered during walking and jogging.  Endocrine: No symptoms reported. Neurological: Denies blurred vision. Psychiatric: Family stress   Remainder of ROS negative.  No past surgical history on file.  Outpatient Medications Prior to Visit  Medication Sig Dispense Refill   cholecalciferol (VITAMIN D3) 25 MCG (1000 UNIT) tablet Take 1,000 Units by mouth daily.     Icosapent Ethyl 0.5 g CAPS Take 4 capsules (2 g total) by mouth 2 (two) times daily. 720 capsule 3   MAGNESIUM GLYCINATE PO Take by mouth.     rosuvastatin (CRESTOR) 10 MG tablet Take 1  tablet (10 mg total) by mouth daily. 90 tablet 3   hydrocortisone 2.5 % cream APPLY TOPICALLY TWICE A DAY (Patient not taking: Reported on 11/12/2023) 28 g 0   ALPRAZolam (XANAX) 0.5 MG tablet Take 1 tablet (0.5 mg total) by mouth as needed for anxiety (Take 1 hour before MRI). (Patient not taking: Reported on 10/11/2023) 1 tablet 0   No facility-administered medications prior to visit.    Family History  Problem Relation Age of Onset   Diabetes Mother    Hypertension Mother    Hypertension Daughter     Social History   Socioeconomic History   Marital status: Married    Spouse name: [name]   Number of children: 3   Years of education: Not on file   Highest education level: Associate degree: academic program  Occupational History   Occupation: Advertising account planner    Comment: [employer]  Tobacco Use   Smoking status: Former    Current packs/day: 0.00    Average packs/day: 0.5 packs/day for 30.0 years (15.0 ttl pk-yrs)    Types: Cigarettes    Start date: 12/11/1978    Quit date: 12/11/2008    Years since quitting: 14.9   Smokeless tobacco: Never  Vaping Use   Vaping status: Never Used  Substance and Sexual Activity   Alcohol use: No    Alcohol/week: 0.0 standard drinks of alcohol   Drug use: No   Sexual activity: Yes    Partners: Female    Birth control/protection: Surgical  Other Topics Concern   Not on file  Social History Narrative   Moved to [place] in 1989 from [place]   Lives with wife and 3 daughters      Wears seatbelt 100%   Guns in home - yes secured   Social Determinants of Health   Financial Resource Strain: Low Risk  (10/07/2023)   Overall Financial Resource Strain (CARDIA)    Difficulty of Paying Living Expenses: Not hard at all  Food Insecurity: No Food Insecurity (10/07/2023)   Hunger Vital Sign    Worried About Running Out of Food in the Last Year: Never true    Ran Out of Food in the Last Year: Never true  Transportation Needs: No Transportation  Needs (10/07/2023)   PRAPARE - Administrator, Civil Service (Medical): No    Lack of Transportation (Non-Medical): No  Physical Activity: Sufficiently Active (10/07/2023)   Exercise Vital Sign    Days of Exercise per Week: 7 days    Minutes of Exercise per Session: 30 min  Stress: No Stress Concern Present (10/07/2023)   [employer] of Occupational Health - Occupational Stress Questionnaire    Feeling of Stress : Not at all  Social Connections: Moderately Integrated (10/07/2023)   Social Connection and Isolation Panel [NHANES]    Frequency of Communication with Friends and Family: More than three times a week    Frequency of Social Gatherings with Friends and Family: Once a week    Attends Religious Services: More than 4 times per year    Active Member of [employer] or Organizations: No    Attends Banker Meetings: Not on file    Marital Status: Married  Intimate Partner Violence: Unknown (09/03/2023)   Received from [facility]   HITS    Physically Hurt: Not on file    Insult or Talk Down To: Not on file    Threaten Physical Harm: Not on file    Scream or Curse: Not on file                                                                                                  Objective:  Physical Exam: BP 128/80   Pulse 80   Temp 97.8 F (36.6 C) (Temporal)   Wt 176 lb 3.2 oz (79.9 kg)   SpO2 99%   BMI 25.28 kg/m   Wt Readings from Last 3 Encounters:  11/12/23 176 lb 3.2 oz (79.9 kg)  10/11/23 179 lb 12.8 oz (81.6 kg)  10/09/23 175 lb (79.4 kg)     Physical Exam Constitutional:      Appearance: Normal appearance.  HENT:     Head: Normocephalic and atraumatic.     Right Ear: Hearing normal.     Left Ear: Hearing normal.     Nose: Nose normal.  Eyes:     General: No scleral icterus.       Right eye: No discharge.        Left eye: No discharge.     Extraocular Movements: Extraocular movements intact.  Cardiovascular:     Rate and Rhythm:  Normal rate and regular rhythm.  Heart sounds: Normal heart sounds.  Pulmonary:     Effort: Pulmonary effort is normal.     Breath sounds: Normal breath sounds.  Abdominal:     Palpations: Abdomen is soft.     Tenderness: There is no abdominal tenderness.  Skin:    General: Skin is warm.     Findings: No rash.  Neurological:     General: No focal deficit present.     Mental Status: He is alert.     Cranial Nerves: No cranial nerve deficit.  Psychiatric:        Mood and Affect: Mood normal.        Behavior: Behavior normal.        Thought Content: Thought content normal.        Judgment: Judgment normal.     MR LUMBAR SPINE WO CONTRAST  Result Date: 10/25/2023 CLINICAL DATA:  Low back pain extending into the lower extremities for 6 months. EXAM: MRI LUMBAR SPINE WITHOUT CONTRAST TECHNIQUE: Multiplanar, multisequence MR imaging of the lumbar spine was performed. No intravenous contrast was administered. COMPARISON:  None Available. FINDINGS: Segmentation: 5 non rib-bearing lumbar type vertebral bodies are present. The lowest fully formed vertebral body is L5. Alignment: Slight retrolisthesis is present at L3-4. No other significant listhesis is present. Rightward curvature is centered at L3. Lumbar lordosis is within normal limits. Vertebrae:  Marrow signal and vertebral body heights are normal. Conus medullaris and cauda equina: Conus extends to the L1-2 level. Conus and cauda equina appear normal. Paraspinal and other soft tissues: Limited imaging the abdomen is unremarkable. There is no significant adenopathy. No solid organ lesions are present. Disc levels: L1-2: Insert normal disc L2-3: Mild leftward disc bulging is present without focal protrusion or stenosis. L3-4: A broad-based disc protrusion is present. Moderate facet hypertrophy and spurring is present. Severe central canal stenosis is present with crowding of the nerve roots. Moderate foraminal stenosis is present bilaterally.  L4-5: A broad-based disc protrusion is present. Moderate facet hypertrophy is worse on the right. Moderate central canal stenosis is present with crowding of the nerve roots. Subarticular narrowing is worse right than left. Moderate right and mild left foraminal stenosis is present. L5-S1: A rightward disc protrusion is present. Moderate facet hypertrophy is worse on the right. Mild right subarticular narrowing is present. The foramina are patent bilaterally. IMPRESSION: 1. Severe central canal stenosis at L3-4 with crowding of the nerve roots. 2. Moderate foraminal stenosis bilaterally at L3-4. 3. Moderate central canal stenosis at L4-5 with crowding of the nerve roots. Subarticular narrowing is worse right than left. Moderate right and mild left foraminal stenosis is present. 4. Mild right subarticular narrowing at L5-S1. Electronically Signed   By: Marin Roberts M.D.   On: 10/25/2023 14:54   CT ANGIO CHEST AORTA W/CM & OR WO/CM  Result Date: 10/01/2023 CLINICAL DATA:  Preoperative planning for thoracic aortic disease. The ascending aorta was measured at 4.1 cm on partial chest CT without contrast for coronary calcium scoring. EXAM: CT ANGIOGRAPHY CHEST WITH CONTRAST TECHNIQUE: Multidetector CT imaging of the chest was performed using the standard protocol during bolus administration of intravenous contrast. Multiplanar CT image reconstructions and MIPs were obtained to evaluate the vascular anatomy. RADIATION DOSE REDUCTION: This exam was performed according to the departmental dose-optimization program which includes automated exposure control, adjustment of the mA and/or kV according to patient size and/or use of iterative reconstruction technique. CONTRAST:  75mL ISOVUE-370 IOPAMIDOL (ISOVUE-370) INJECTION 76% COMPARISON:  Coronary calcium scoring  partial chest CT 09/05/2023. FINDINGS: Cardiovascular: The cardiac size is normal. Calcification is again noted in the proximal LAD coronary artery and  not seen in the other major coronary vessels. There is no pericardial effusion. The pulmonary veins and arteries are normal in caliber. The pulmonary arteries are centrally clear. There is a mildly tortuous thoracic aorta with scattered traces of calcific plaque in the descending segment. The aortic root measures 4.1 cm at the sinuses of Valsalva. The ascending aorta maximum caliber is 3.9 cm AP and transverse. It may have been slightly overestimated on the noncontrast CT due to measurement at an obliquity. There is no aortic dissection, penetrating ulcer or stenosis. The great vessels branch normally and are clear. The rest of the aorta is within normal caliber limits. Mediastinum/Nodes: No enlarged mediastinal, hilar, or axillary lymph nodes. Thyroid gland, trachea, and esophagus demonstrate no significant findings. Lungs/Pleura: Small linear scar-like opacities both lung apices. No consolidation, effusion or pneumothorax. Rest of the lungs are clear. Upper Abdomen: 4.3 cm Bosniak 1 cyst in the anterior left kidney, Hounsfield density of 14. 1.3 cm Bosniak 2 cyst in the anterior aspect of the upper right kidney, Hounsfield density is 21. Additional too small to characterize bilateral renal hypodensities consistent with a Bosniak 2 cysts. No specific follow-up for these findings is warranted, but there is also a 1.2 cm indeterminate lesion in the posterior upper left kidney, Hounsfield density of 73, which warrants follow-up MRI without and with contrast for further study. The liver is steatotic, mildly prominent. No acute upper abdominal findings or adrenal mass. 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 No acute or other significant osseous findings. The ribcage is intact. No chest wall mass. Review of the MIP images confirms the above findings. IMPRESSION: 1. 4.1 cm aortic root and 3.9 cm ascending aorta. Recommend annual imaging followup by CTA or MRA. This  recommendation follows 2010 ACCF/AHA/AATS/ACR/ASA/SCA/SCAI/ SIR/STS/SVM Guidelines for the Diagnosis and Management of Patients with Thoracic Aortic Disease. Circulation. 2010; 121: [id number]. Aortic aneurysm NOS (ICD10-I71.9). 2. Minimal calcific plaque in the descending segment, with tortuosity. 3. Coronary artery atherosclerosis, LAD only. 4. 1.2 cm indeterminate lesion in the posterior upper left kidney. MRI without and recommended. Additional Bosniak 1 and 2 cysts. 5. Hepatic steatosis. 6. 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 Electronically Signed   By: Almira Bar M.D.   On: 10/01/2023 02:14   CT CARDIAC SCORING (SELF PAY ONLY)  Addendum Date: 09/17/2023   ADDENDUM REPORT: 09/17/2023 07:57 EXAM: OVER-READ INTERPRETATION  CT CHEST The following report is an over-read performed by radiologist Dr. Royal [facility] on 09/17/2023. This over-read does not include interpretation of cardiac or coronary anatomy or pathology. The coronary artery calcium score interpretation by the cardiologist is attached. COMPARISON:  No priors FINDINGS: Atherosclerotic calcifications in the thoracic aorta with mild ectasia of the ascending thoracic aorta (4.1 cm in diameter). Within the visualized portions of the thorax there are no suspicious appearing pulmonary nodules or masses, there is no acute consolidative airspace disease, no pleural effusions, no pneumothorax and no lymphadenopathy. Visualized portions of the upper abdomen are unremarkable. There are no aggressive appearing lytic or blastic lesions noted in the visualized portions of the skeleton. IMPRESSION: 1.  Aortic Atherosclerosis (ICD10-I70.0). 2. Ectasia of ascending thoracic aorta (4.1 cm in diameter). Recommend annual imaging followup by CTA or MRA. This recommendation follows 2010 ACCF/AHA/AATS/ACR/ASA/SCA/SCAI/SIR/STS/SVM Guidelines for the Diagnosis and Management of Patients with Thoracic Aortic Disease.  Circulation. 2010;  121: [id number]. Aortic aneurysm NOS (ICD10-I71.9). Electronically Signed   By: Trudie Reed M.D.   On: 09/17/2023 07:57   Result Date: 09/17/2023 CLINICAL DATA:  Cardiovascular disease risk stratification CAD screening, intermediate CAD risk, treadmill candidate EXAM: CT Coronary Calcium Score TECHNIQUE: A gated, non-contrast computed tomography scan of the heart was performed using 3mm slice thickness. Axial images were analyzed on a dedicated workstation. Calcium scoring of the coronary arteries was performed using the Agatston method. FINDINGS: Coronary Calcium Score: Left main: 0 Left anterior descending artery: 131 Left circumflex artery: 0 Right coronary artery: 0 Total: 131 Percentile: 87th Pericardium: Normal. Ascending Aorta: Mild dilation of ascending aorta, measures approximately 42mm at the mid ascending aorta measured in a non-contrast axial plane. Non-cardiac: See separate report from [facility]. IMPRESSION: 1. Coronary calcium score of 131. This was 87th percentile for age-, race-, and sex-matched controls. 2. Mild dilation of ascending aorta, measures approximately 42mm at the mid ascending aorta measured in a non-contrast axial plane. RECOMMENDATIONS: Coronary artery calcium (CAC) score is a strong predictor of incident coronary heart disease (CHD) and provides predictive information beyond traditional risk factors. CAC scoring is reasonable to use in the decision to withhold, postpone, or initiate statin therapy in intermediate-risk or selected borderline-risk asymptomatic adults (age 60-75 years and LDL-C >=70 to <190 mg/dL) who do not have diabetes or established atherosclerotic cardiovascular disease (ASCVD).* In intermediate-risk (10-year ASCVD risk >=7.5% to <20%) adults or selected borderline-risk (10-year ASCVD risk >=5% to <7.5%) adults in whom a CAC score is measured for the purpose of making a treatment decision the following recommendations have been  made: If CAC=0, it is reasonable to withhold statin therapy and reassess in 5 to 10 years, as long as higher risk conditions are absent (diabetes mellitus, family history of premature CHD in first degree relatives (males <55 years; females <65 years), cigarette smoking, or LDL >=190 mg/dL). If CAC is 1 to 99, it is reasonable to initiate statin therapy for patients >=35 years of age. If CAC is >=100 or >=75th percentile, it is reasonable to initiate statin therapy at any age. Cardiology referral should be considered for patients with CAC scores >=400 or >=75th percentile. *2018 AHA/ACC/AACVPR/AAPA/ABC/ACPM/ADA/AGS/APhA/ASPC/NLA/PCNA Guideline on the Management of Blood Cholesterol: A Report of the American College of Cardiology/American Heart Association Task Force on Clinical Practice Guidelines. J Am Coll Cardiol. 2019;73(24):3168-3209. Electronically Signed: By: Weston Brass M.D. On: 09/05/2023 19:55    Recent Results (from the past 2160 hour(s))  Lipid Profile     Status: Abnormal   Collection Time: 09/03/23 10:12 AM  Result Value Ref Range   Cholesterol 198 0 - 200 mg/dL    Comment: ATP III Classification       Desirable:  < 200 mg/dL               Borderline High:  200 - 239 mg/dL          High:  > = 161 mg/dL   Triglycerides 096.0 0.0 - 149.0 mg/dL    Comment: Normal:  <454 mg/dLBorderline High:  150 - 199 mg/dL   HDL 09.81 >19.14 mg/dL   VLDL 78.2 0.0 - 95.6 mg/dL   LDL Cholesterol 213 (H) 0 - 99 mg/dL   Total CHOL/HDL Ratio 4     Comment:                Men          Women1/2 Average Risk     3.4  3.3Average Risk          5.0          4.42X Average Risk          9.6          7.13X Average Risk          15.0          11.0                       NonHDL 144.73     Comment: NOTE:  Non-HDL goal should be 30 mg/dL higher than patient's LDL goal (i.e. LDL goal of < 70 mg/dL, would have non-HDL goal of < 100 mg/dL)        Garner Nash, MD, MS

## 2023-11-13 DIAGNOSIS — M5126 Other intervertebral disc displacement, lumbar region: Secondary | ICD-10-CM | POA: Diagnosis not present

## 2023-11-14 DIAGNOSIS — M5441 Lumbago with sciatica, right side: Secondary | ICD-10-CM | POA: Diagnosis not present

## 2023-11-14 DIAGNOSIS — M9901 Segmental and somatic dysfunction of cervical region: Secondary | ICD-10-CM | POA: Diagnosis not present

## 2023-11-14 DIAGNOSIS — M9903 Segmental and somatic dysfunction of lumbar region: Secondary | ICD-10-CM | POA: Diagnosis not present

## 2023-11-14 DIAGNOSIS — M9902 Segmental and somatic dysfunction of thoracic region: Secondary | ICD-10-CM | POA: Diagnosis not present

## 2023-11-14 LAB — PSA: Prostate Specific Ag, Serum: 0.8 ng/mL (ref 0.0–4.0)

## 2023-11-16 ENCOUNTER — Encounter: Payer: Self-pay | Admitting: Family Medicine

## 2023-11-16 LAB — URINALYSIS W MICROSCOPIC + REFLEX CULTURE
Bacteria, UA: NONE SEEN /[HPF]
Bilirubin Urine: NEGATIVE
Glucose, UA: NEGATIVE
Hgb urine dipstick: NEGATIVE
Hyaline Cast: NONE SEEN /[LPF]
Ketones, ur: NEGATIVE
Leukocyte Esterase: NEGATIVE
Nitrites, Initial: NEGATIVE
Protein, ur: NEGATIVE
RBC / HPF: NONE SEEN /[HPF] (ref 0–2)
Specific Gravity, Urine: 1.015 (ref 1.001–1.035)
Squamous Epithelial / HPF: NONE SEEN /[HPF] (ref <=5)
WBC, UA: NONE SEEN /[HPF] (ref 0–5)
pH: 7.5 (ref 5.0–8.0)

## 2023-11-16 LAB — NO CULTURE INDICATED

## 2023-11-16 LAB — VITAMIN D 1,25 DIHYDROXY
Vitamin D 1, 25 (OH)2 Total: 51 pg/mL (ref 18–72)
Vitamin D2 1, 25 (OH)2: <8 pg/mL
Vitamin D3 1, 25 (OH)2: 51 pg/mL

## 2023-11-19 DIAGNOSIS — M5441 Lumbago with sciatica, right side: Secondary | ICD-10-CM | POA: Diagnosis not present

## 2023-11-19 DIAGNOSIS — M9903 Segmental and somatic dysfunction of lumbar region: Secondary | ICD-10-CM | POA: Diagnosis not present

## 2023-11-19 DIAGNOSIS — M9901 Segmental and somatic dysfunction of cervical region: Secondary | ICD-10-CM | POA: Diagnosis not present

## 2023-11-19 DIAGNOSIS — M9902 Segmental and somatic dysfunction of thoracic region: Secondary | ICD-10-CM | POA: Diagnosis not present

## 2023-11-21 DIAGNOSIS — M5441 Lumbago with sciatica, right side: Secondary | ICD-10-CM | POA: Diagnosis not present

## 2023-11-21 DIAGNOSIS — M9902 Segmental and somatic dysfunction of thoracic region: Secondary | ICD-10-CM | POA: Diagnosis not present

## 2023-11-21 DIAGNOSIS — M9903 Segmental and somatic dysfunction of lumbar region: Secondary | ICD-10-CM | POA: Diagnosis not present

## 2023-11-21 DIAGNOSIS — M9901 Segmental and somatic dysfunction of cervical region: Secondary | ICD-10-CM | POA: Diagnosis not present

## 2023-11-24 ENCOUNTER — Ambulatory Visit
Admission: RE | Admit: 2023-11-24 | Discharge: 2023-11-24 | Disposition: A | Discharge status: Home / Self Care | Payer: 59 | Source: Ambulatory Visit | Attending: Family Medicine | Admitting: Family Medicine

## 2023-11-24 DIAGNOSIS — N2889 Other specified disorders of kidney and ureter: Secondary | ICD-10-CM

## 2023-11-24 DIAGNOSIS — N281 Cyst of kidney, acquired: Secondary | ICD-10-CM | POA: Diagnosis not present

## 2023-11-24 MED ORDER — GADOPICLENOL 0.5 MMOL/ML IV SOLN
8.0000 mL | Freq: Once | INTRAVENOUS | Status: AC | PRN
  Administered 2023-11-24: 8 mL via INTRAVENOUS

## 2023-11-26 DIAGNOSIS — M9902 Segmental and somatic dysfunction of thoracic region: Secondary | ICD-10-CM | POA: Diagnosis not present

## 2023-11-26 DIAGNOSIS — M9901 Segmental and somatic dysfunction of cervical region: Secondary | ICD-10-CM | POA: Diagnosis not present

## 2023-11-26 DIAGNOSIS — M5441 Lumbago with sciatica, right side: Secondary | ICD-10-CM | POA: Diagnosis not present

## 2023-11-26 DIAGNOSIS — M9903 Segmental and somatic dysfunction of lumbar region: Secondary | ICD-10-CM | POA: Diagnosis not present

## 2023-11-27 ENCOUNTER — Encounter: Payer: Self-pay | Admitting: Nurse Practitioner

## 2023-11-28 DIAGNOSIS — M9902 Segmental and somatic dysfunction of thoracic region: Secondary | ICD-10-CM | POA: Diagnosis not present

## 2023-11-28 DIAGNOSIS — M9903 Segmental and somatic dysfunction of lumbar region: Secondary | ICD-10-CM | POA: Diagnosis not present

## 2023-11-28 DIAGNOSIS — M9901 Segmental and somatic dysfunction of cervical region: Secondary | ICD-10-CM | POA: Diagnosis not present

## 2023-11-28 DIAGNOSIS — M5441 Lumbago with sciatica, right side: Secondary | ICD-10-CM | POA: Diagnosis not present

## 2023-12-17 DIAGNOSIS — M5441 Lumbago with sciatica, right side: Secondary | ICD-10-CM | POA: Diagnosis not present

## 2023-12-17 DIAGNOSIS — M9903 Segmental and somatic dysfunction of lumbar region: Secondary | ICD-10-CM | POA: Diagnosis not present

## 2023-12-17 DIAGNOSIS — M9901 Segmental and somatic dysfunction of cervical region: Secondary | ICD-10-CM | POA: Diagnosis not present

## 2023-12-17 DIAGNOSIS — M9902 Segmental and somatic dysfunction of thoracic region: Secondary | ICD-10-CM | POA: Diagnosis not present

## 2024-01-01 ENCOUNTER — Encounter: Payer: Self-pay | Admitting: Family Medicine

## 2024-01-01 ENCOUNTER — Ambulatory Visit: Payer: 59 | Admitting: Family Medicine

## 2024-01-01 VITALS — BP 128/84 | HR 79 | Temp 97.6°F | Ht 69.0 in | Wt 180.2 lb

## 2024-01-01 DIAGNOSIS — L03011 Cellulitis of right finger: Secondary | ICD-10-CM | POA: Diagnosis not present

## 2024-01-01 DIAGNOSIS — E782 Mixed hyperlipidemia: Secondary | ICD-10-CM

## 2024-01-01 DIAGNOSIS — M48061 Spinal stenosis, lumbar region without neurogenic claudication: Secondary | ICD-10-CM

## 2024-01-01 DIAGNOSIS — L309 Dermatitis, unspecified: Secondary | ICD-10-CM | POA: Diagnosis not present

## 2024-01-01 DIAGNOSIS — I7121 Aneurysm of the ascending aorta, without rupture: Secondary | ICD-10-CM | POA: Diagnosis not present

## 2024-01-01 DIAGNOSIS — I10 Essential (primary) hypertension: Secondary | ICD-10-CM

## 2024-01-01 MED ORDER — ROSUVASTATIN CALCIUM 5 MG PO TABS: 5.0000 mg | ORAL_TABLET | ORAL | 3 refills | Status: DC

## 2024-01-01 NOTE — Assessment & Plan Note (Addendum)
Stable.  Plan: Continue nebivolol 2.5 mg daily to manage blood pressure and reduce aortic wall stress. Adjust rosuvastatin dose to 5 mg, taken three times weekly, per patient's request to reduce dosage due to high intake of fish oil supplements. Plan to re-evaluate lipid profile in two months to assess effectiveness of adjusted dosing. Emphasized importance of medication adherence to prevent aneurysm progression. Continue regular monitoring as per cardiology guidelines. Instructed patient to report any new symptoms such as chest pain or shortness of breath immediately.

## 2024-01-01 NOTE — Assessment & Plan Note (Signed)
Ongoing back pain due to spinal stenosis; patient reports inadequate relief with previous physical therapy and chiropractic care.  Plan: Provided referral for aquatic physical therapy to alleviate symptoms through reduced spinal load and improved mobility. Return to clinic if symptoms persist or escalate.

## 2024-01-01 NOTE — Progress Notes (Signed)
Assessment/Plan:   Problem List Items Addressed This Visit       Cardiovascular and Mediastinum   Primary hypertension   Relevant Medications   rosuvastatin (CRESTOR) 5 MG tablet   Aneurysm of ascending aorta without rupture (HCC)   Stable.  Plan: Continue nebivolol 2.5 mg daily to manage blood pressure and reduce aortic wall stress. Adjust rosuvastatin dose to 5 mg, taken three times weekly, per patient's request to reduce dosage due to high intake of fish oil supplements. Plan to re-evaluate lipid profile in two months to assess effectiveness of adjusted dosing. Emphasized importance of medication adherence to prevent aneurysm progression. Continue regular monitoring as per cardiology guidelines. Instructed patient to report any new symptoms such as chest pain or shortness of breath immediately.       Relevant Medications   rosuvastatin (CRESTOR) 5 MG tablet     Musculoskeletal and Integument   Paronychia of finger of right hand - Primary   Patient presents with mild perionychia of the right middle finger. Remainder of fingertips also with redness and irritation but do not appear infected. All is likely secondary to mechanical irritation from a recent manicure involving mechanical debridement.   Plan: Monitor the affected finger for signs of worsening infection. Educated patient on signs of infection requiring prompt medical attention, including increased redness, swelling, pain, or drainage. No antibiotics prescribed at this time per patient's preference. Return to clinic if symptoms worsen or fail to continue improving. Seek immediate care if systemic symptoms develop.      Hand dermatitis     Other   Hyperlipidemia   Relevant Medications   rosuvastatin (CRESTOR) 5 MG tablet   Spinal stenosis of lumbar region without neurogenic claudication   Ongoing back pain due to spinal stenosis; patient reports inadequate relief with previous physical therapy and chiropractic  care.  Plan: Provided referral for aquatic physical therapy to alleviate symptoms through reduced spinal load and improved mobility. Return to clinic if symptoms persist or escalate.      Relevant Orders   Ambulatory referral to Physical Therapy    Medications Discontinued During This Encounter  Medication Reason   rosuvastatin (CRESTOR) 10 MG tablet     No follow-ups on file.    Subjective:   Encounter date: 01/01/2024  Nathan Kelly is a 58 y.o. male who has Screen for colon cancer; Vitamin D deficiency; Hyperlipidemia; Acne vulgaris; Overweight (BMI 25.0-29.9); Primary hypertension; Eczema; Paronychia of finger of right hand; Statin medication declined by patient; Chronic midline low back pain with right-sided sciatica; Situational anxiety; Aneurysm of ascending aorta without rupture (HCC); Kidney lesion, native, left; Spinal stenosis of lumbar region without neurogenic claudication; and Hand dermatitis on their problem list..   He  has a past medical history of Eczema (06/27/2022), Hyperglycemia (03/28/2022), Hyperlipidemia, and Paronychia of finger of right hand (12/03/2022)..   Chief Complaint: Redness and swelling of fingertips; concerns about underlying systemic issues.  History of Present Illness:  Fingertip redness: Patient reports redness and mild swelling at the tips of all fingers on both hands, with the right hand being more affected, ongoing for approximately two weeks. Symptoms began after a friend performed a manicure involving nail clipping, filing, and mechanical debridement with a small-tipped machine around the nails. Denies significant pain, stating discomfort only upon pressure; right middle finger is mildly tender. No drainage observed from any fingers. Patient is concerned about the possibility of systemic conditions such as diabetes but is reassured that symptoms are not indicative of diabetes.  Reports improvement in symptoms over  time.  Hypertension: Blood pressure has been well-controlled. Currently taking nebivolol    Hyperlipidemia.   Wishes to adjust rosuvastatin dosage due to high intake of fish oil icosapent ethyl   Spinal stenosis: Reports ongoing back pain from spinal stenosis; previous physical therapy and chiropractic treatments were ineffective and sometimes exacerbated pain. Inquires about alternative treatments,   Review of Systems:  Constitutional: Denies fever, chills, weight changes. Skin: Redness and mild swelling of fingertips; no other rashes. Musculoskeletal: Reports back pain related to spinal stenosis; denies new joint pain. Neurological: Denies numbness, tingling, or weakness. Cardiovascular: Denies chest pain, palpitations. Respiratory: Denies shortness of breath, cough. Gastrointestinal: Denies abdominal pain, changes in bowel habits. Endocrine: Denies symptoms of hyperglycemia or hypoglycemia. Psychiatric: Mood is normal; expresses health concerns. Remainder of systems: Negative.    No past surgical history on file.  Outpatient Medications Prior to Visit  Medication Sig Dispense Refill   cholecalciferol (VITAMIN D3) 25 MCG (1000 UNIT) tablet Take 1,000 Units by mouth daily.     MAGNESIUM GLYCINATE PO Take by mouth.     nebivolol (BYSTOLIC) 2.5 MG tablet Take 1 tablet (2.5 mg total) by mouth daily. 90 tablet 3   rosuvastatin (CRESTOR) 10 MG tablet Take 1 tablet (10 mg total) by mouth daily. 90 tablet 3   hydrocortisone 2.5 % cream APPLY TOPICALLY TWICE A DAY (Patient not taking: Reported on 01/01/2024) 28 g 0   Icosapent Ethyl 0.5 g CAPS Take 4 capsules (2 g total) by mouth 2 (two) times daily. (Patient not taking: Reported on 01/01/2024) 720 capsule 3   No facility-administered medications prior to visit.    Family History  Problem Relation Age of Onset   Diabetes Mother    Hypertension Mother    Hypertension Daughter     Social History   Socioeconomic History    Marital status: Married    Spouse name: [name]   Number of children: 3   Years of education: Not on file   Highest education level: Associate degree: academic program  Occupational History   Occupation: Advertising account planner    Comment: [employer]  Tobacco Use   Smoking status: Former    Current packs/day: 0.00    Average packs/day: 0.5 packs/day for 30.0 years (15.0 ttl pk-yrs)    Types: Cigarettes    Start date: 12/11/1978    Quit date: 12/11/2008    Years since quitting: 15.0   Smokeless tobacco: Never  Vaping Use   Vaping status: Never Used  Substance and Sexual Activity   Alcohol use: No    Alcohol/week: 0.0 standard drinks of alcohol   Drug use: No   Sexual activity: Yes    Partners: Female    Birth control/protection: Surgical  Other Topics Concern   Not on file  Social History Narrative   Moved to [place] in 1989 from [place]   Lives with wife and 3 daughters      Wears seatbelt 100%   Guns in home - yes secured   Social Drivers of Health   Financial Resource Strain: Low Risk  (10/07/2023)   Overall Financial Resource Strain (CARDIA)    Difficulty of Paying Living Expenses: Not hard at all  Food Insecurity: No Food Insecurity (10/07/2023)   Hunger Vital Sign    Worried About Running Out of Food in the Last Year: Never true    Ran Out of Food in the Last Year: Never true  Transportation Needs: No Transportation Needs (10/07/2023)  PRAPARE - Administrator, Civil Service (Medical): No    Lack of Transportation (Non-Medical): No  Physical Activity: Sufficiently Active (10/07/2023)   Exercise Vital Sign    Days of Exercise per Week: 7 days    Minutes of Exercise per Session: 30 min  Stress: No Stress Concern Present (10/07/2023)   [employer] of Occupational Health - Occupational Stress Questionnaire    Feeling of Stress : Not at all  Social Connections: Moderately Integrated (10/07/2023)   Social Connection and Isolation Panel [NHANES]     Frequency of Communication with Friends and Family: More than three times a week    Frequency of Social Gatherings with Friends and Family: Once a week    Attends Religious Services: More than 4 times per year    Active Member of [employer] or Organizations: No    Attends Banker Meetings: Not on file    Marital Status: Married  Intimate Partner Violence: Unknown (09/03/2023)   Received from [facility]   HITS    Physically Hurt: Not on file    Insult or Talk Down To: Not on file    Threaten Physical Harm: Not on file    Scream or Curse: Not on file                                                                                                  Objective:  Physical Exam: BP 128/84 (BP Location: Left Arm, Patient Position: Sitting, Cuff Size: Normal)   Pulse 79   Temp 97.6 F (36.4 C)   Ht 5\' 9"  (1.753 m)   Wt 180 lb 3.2 oz (81.7 kg)   SpO2 100%   BMI 26.61 kg/m     Physical Exam Constitutional:      Appearance: Normal appearance.  HENT:     Head: Normocephalic and atraumatic.     Right Ear: Hearing normal.     Left Ear: Hearing normal.     Nose: Nose normal.  Eyes:     General: No scleral icterus.       Right eye: No discharge.        Left eye: No discharge.     Extraocular Movements: Extraocular movements intact.  Cardiovascular:     Rate and Rhythm: Normal rate and regular rhythm.     Heart sounds: Normal heart sounds.  Pulmonary:     Effort: Pulmonary effort is normal.     Breath sounds: Normal breath sounds.  Abdominal:     Palpations: Abdomen is soft.     Tenderness: There is no abdominal tenderness.  Musculoskeletal:     Comments: Mild redness and swelling at the fingertips of both hands; right middle finger exhibits slightly more redness consistent with perionychia without drainage.  Nontender  Skin:    General: Skin is warm.     Findings: No rash.  Neurological:     General: No focal deficit present.     Mental Status: He is alert.      Cranial Nerves: No cranial nerve deficit.  Psychiatric:  Mood and Affect: Mood normal.        Behavior: Behavior normal.        Thought Content: Thought content normal.        Judgment: Judgment normal.     MR Abdomen W Wo Contrast Result Date: 11/25/2023 CLINICAL DATA:  Characterize indeterminate left renal lesion incidentally identified by prior CT EXAM: MRI ABDOMEN WITHOUT AND WITH CONTRAST TECHNIQUE: Multiplanar multisequence MR imaging of the abdomen was performed both before and after the administration of intravenous contrast. CONTRAST:  8 mL Vueway gadolinium contrast IV COMPARISON:  CT chest, 09/25/2023 FINDINGS: Lower chest: No acute abnormality. Hepatobiliary: No solid liver abnormality is seen. No gallstones, gallbladder wall thickening, or biliary dilatation. Pancreas: Unremarkable. No pancreatic ductal dilatation or surrounding inflammatory changes. Spleen: Normal in size without significant abnormality. Adrenals/Urinary Tract: Adrenal glands are unremarkable. Numerous simple and thinly septated, benign bilateral fluid signal renal cortical cysts, as well as occasional intrinsically T1 hyperintense hemorrhagic or proteinaceous cysts, including a small cyst of the posterior superior pole of the left kidney previously of interest (series 10, image 45). No further follow-up or characterization is required for these benign cysts. Kidneys are normal, without renal calculi, solid lesion, or hydronephrosis. Stomach/Bowel: Stomach is within normal limits. No evidence of bowel wall thickening, distention, or inflammatory changes. Vascular/Lymphatic: No significant vascular findings are present. No enlarged abdominal lymph nodes. Other: No abdominal wall hernia or abnormality. No ascites. Musculoskeletal: No acute or significant osseous findings. IMPRESSION: 1. Numerous simple and thinly septated, benign bilateral fluid signal renal cortical cysts, as well as occasional intrinsically T1  hyperintense hemorrhagic or proteinaceous cysts, including a small cyst of the posterior superior pole of the left kidney previously of interest. No further follow-up or characterization is required for these benign cysts. 2. No acute findings in the abdomen. Electronically Signed   By: Jearld Lesch M.D.   On: 11/25/2023 15:24   MR LUMBAR SPINE WO CONTRAST Result Date: 10/25/2023 CLINICAL DATA:  Low back pain extending into the lower extremities for 6 months. EXAM: MRI LUMBAR SPINE WITHOUT CONTRAST TECHNIQUE: Multiplanar, multisequence MR imaging of the lumbar spine was performed. No intravenous contrast was administered. COMPARISON:  None Available. FINDINGS: Segmentation: 5 non rib-bearing lumbar type vertebral bodies are present. The lowest fully formed vertebral body is L5. Alignment: Slight retrolisthesis is present at L3-4. No other significant listhesis is present. Rightward curvature is centered at L3. Lumbar lordosis is within normal limits. Vertebrae:  Marrow signal and vertebral body heights are normal. Conus medullaris and cauda equina: Conus extends to the L1-2 level. Conus and cauda equina appear normal. Paraspinal and other soft tissues: Limited imaging the abdomen is unremarkable. There is no significant adenopathy. No solid organ lesions are present. Disc levels: L1-2: Insert normal disc L2-3: Mild leftward disc bulging is present without focal protrusion or stenosis. L3-4: A broad-based disc protrusion is present. Moderate facet hypertrophy and spurring is present. Severe central canal stenosis is present with crowding of the nerve roots. Moderate foraminal stenosis is present bilaterally. L4-5: A broad-based disc protrusion is present. Moderate facet hypertrophy is worse on the right. Moderate central canal stenosis is present with crowding of the nerve roots. Subarticular narrowing is worse right than left. Moderate right and mild left foraminal stenosis is present. L5-S1: A rightward disc  protrusion is present. Moderate facet hypertrophy is worse on the right. Mild right subarticular narrowing is present. The foramina are patent bilaterally. IMPRESSION: 1. Severe central canal stenosis at L3-4 with crowding  of the nerve roots. 2. Moderate foraminal stenosis bilaterally at L3-4. 3. Moderate central canal stenosis at L4-5 with crowding of the nerve roots. Subarticular narrowing is worse right than left. Moderate right and mild left foraminal stenosis is present. 4. Mild right subarticular narrowing at L5-S1. Electronically Signed   By: Marin Roberts M.D.   On: 10/25/2023 14:54    Recent Results (from the past 2160 hours)  Magnesium     Status: None   Collection Time: 11/12/23 10:01 AM  Result Value Ref Range   Magnesium 2.1 1.5 - 2.5 mg/dL  Urinalysis w microscopic + reflex cultur     Status: None   Collection Time: 11/12/23 10:01 AM   Specimen: Blood  Result Value Ref Range   Color, Urine YELLOW YELLOW   APPearance CLEAR CLEAR   Specific Gravity, Urine 1.015 1.001 - 1.035   pH 7.5 5.0 - 8.0   Glucose, UA NEGATIVE NEGATIVE   Bilirubin Urine NEGATIVE NEGATIVE   Ketones, ur NEGATIVE NEGATIVE   Hgb urine dipstick NEGATIVE NEGATIVE   Protein, ur NEGATIVE NEGATIVE   Nitrites, Initial NEGATIVE NEGATIVE   Leukocyte Esterase NEGATIVE NEGATIVE   WBC, UA NONE SEEN 0 - 5 /HPF   RBC / HPF NONE SEEN 0 - 2 /HPF   Squamous Epithelial / HPF NONE SEEN < OR = 5 /HPF   Bacteria, UA NONE SEEN NONE SEEN /HPF   Hyaline Cast NONE SEEN NONE SEEN /LPF   Note      Comment: This urine was analyzed for the presence of WBC,  RBC, bacteria, casts, and other formed elements.  Only those elements seen were reported. . .   Comprehensive metabolic panel     Status: Abnormal   Collection Time: 11/12/23 10:01 AM  Result Value Ref Range   Sodium 139 135 - 145 mEq/L   Potassium 4.3 3.5 - 5.1 mEq/L   Chloride 103 96 - 112 mEq/L   CO2 29 19 - 32 mEq/L   Glucose, Bld 103 (H) 70 - 99 mg/dL    BUN 15 6 - 23 mg/dL   Creatinine, Ser 1.61 0.40 - 1.50 mg/dL   Total Bilirubin 0.7 0.2 - 1.2 mg/dL   Alkaline Phosphatase 71 39 - 117 U/L   AST 22 0 - 37 U/L   ALT 23 0 - 53 U/L   Total Protein 7.2 6.0 - 8.3 g/dL   Albumin 4.2 3.5 - 5.2 g/dL   GFR 09.60 >45.40 mL/min    Comment: Calculated using the CKD-EPI Creatinine Equation (2021)   Calcium 9.1 8.4 - 10.5 mg/dL  CBC with Differential/Platelet     Status: None   Collection Time: 11/12/23 10:01 AM  Result Value Ref Range   WBC 4.6 4.0 - 10.5 K/uL   RBC 5.38 4.22 - 5.81 Mil/uL   Hemoglobin 15.6 13.0 - 17.0 g/dL   HCT 98.1 19.1 - 47.8 %   MCV 86.7 78.0 - 100.0 fl   MCHC 33.4 30.0 - 36.0 g/dL   RDW 29.5 62.1 - 30.8 %   Platelets 192.0 150.0 - 400.0 K/uL   Neutrophils Relative % 68.0 43.0 - 77.0 %   Lymphocytes Relative 21.5 12.0 - 46.0 %   Monocytes Relative 7.8 3.0 - 12.0 %   Eosinophils Relative 2.1 0.0 - 5.0 %   Basophils Relative 0.6 0.0 - 3.0 %   Neutro Abs 3.1 1.4 - 7.7 K/uL   Lymphs Abs 1.0 0.7 - 4.0 K/uL   Monocytes Absolute 0.4 0.1 -  1.0 K/uL   Eosinophils Absolute 0.1 0.0 - 0.7 K/uL   Basophils Absolute 0.0 0.0 - 0.1 K/uL  Vitamin D 1,25 dihydroxy     Status: None   Collection Time: 11/12/23 10:01 AM  Result Value Ref Range   Vitamin D 1, 25 (OH)2 Total 51 18 - 72 pg/mL   Vitamin D3 1, 25 (OH)2 51 pg/mL   Vitamin D2 1, 25 (OH)2 <8 pg/mL    Comment: (Note) Vitamin D3, 1,25(OH)2 indicates both endogenous  production and supplementation. Vitamin D2, 1,25(OH)2 is  an indicator of exogenous sources, such as diet or  supplementation. Interpretation and therapy are based on  measurement of Vitamin D, 1,25 (OH)2, Total. . This test was developed, and its analytical performance  characteristics have been determined by [employer]. It has not been cleared or approved by the  FDA. This assay has been validated pursuant to the CLIA  regulations and is used for clinical purposes. . For additional information,  please refer to http://education.[facility].com/faq/FAQ199 (This link is being provided for  informational/educational purposes only.) . [facility] [address] [place] [id number] [phone] Junita Push L. Carys Malina Caul, MD, PhD   Microalbumin / creatinine urine ratio     Status: None   Collection Time: 11/12/23 10:01 AM  Result Value Ref Range   Microalb, Ur 1.1 0.0 - 1.9 mg/dL   Creatinine,U 086.5 mg/dL   Microalb Creat Ratio 1.1 0.0 - 30.0 mg/g  Hemoglobin A1c     Status: None   Collection Time: 11/12/23 10:01 AM  Result Value Ref Range   Hgb A1c MFr Bld 6.0 4.6 - 6.5 %    Comment: Glycemic Control Guidelines for People with Diabetes:Non Diabetic:  <6%Goal of Therapy: <7%Additional Action Suggested:  >8%   Lipid panel     Status: Abnormal   Collection Time: 11/12/23 10:01 AM  Result Value Ref Range   Cholesterol 145 0 - 200 mg/dL    Comment: ATP III Classification       Desirable:  < 200 mg/dL               Borderline High:  200 - 239 mg/dL          High:  > = 784 mg/dL   Triglycerides 696.2 (H) 0.0 - 149.0 mg/dL    Comment: Normal:  <952 mg/dLBorderline High:  150 - 199 mg/dL   HDL 84.13 >24.40 mg/dL   VLDL 10.2 0.0 - 72.5 mg/dL   LDL Cholesterol 65 0 - 99 mg/dL   Total CHOL/HDL Ratio 3     Comment:                Men          Women1/2 Average Risk     3.4          3.3Average Risk          5.0          4.42X Average Risk          9.6          7.13X Average Risk          15.0          11.0                       NonHDL 97.22     Comment: NOTE:  Non-HDL goal should be 30 mg/dL higher than patient's LDL goal (i.e. LDL goal  of < 70 mg/dL, would have non-HDL goal of < 100 mg/dL)  TSH     Status: None   Collection Time: 11/12/23 10:01 AM  Result Value Ref Range   TSH 1.04 0.35 - 5.50 uIU/mL  REFLEXIVE URINE CULTURE     Status: None   Collection Time: 11/12/23 10:01 AM  Result Value Ref Range   Reflexve Urine Culture      Comment: NO CULTURE  INDICATED  PSA     Status: None   Collection Time: 11/12/23  2:06 PM  Result Value Ref Range   Prostate Specific Ag, Serum 0.8 0.0 - 4.0 ng/mL    Comment: Roche ECLIA methodology. According to the American Urological Association, Serum PSA should decrease and remain at undetectable levels after radical prostatectomy. The AUA defines biochemical recurrence as an initial PSA value 0.2 ng/mL or greater followed by a subsequent confirmatory PSA value 0.2 ng/mL or greater. Values obtained with different assay methods or kits cannot be used interchangeably. Results cannot be interpreted as absolute evidence of the presence or absence of malignant disease.         Garner Nash, MD, MS

## 2024-01-01 NOTE — Assessment & Plan Note (Signed)
Patient presents with mild perionychia of the right middle finger. Remainder of fingertips also with redness and irritation but do not appear infected. All is likely secondary to mechanical irritation from a recent manicure involving mechanical debridement.   Plan: Monitor the affected finger for signs of worsening infection. Educated patient on signs of infection requiring prompt medical attention, including increased redness, swelling, pain, or drainage. No antibiotics prescribed at this time per patient's preference. Return to clinic if symptoms worsen or fail to continue improving. Seek immediate care if systemic symptoms develop.

## 2024-01-02 DIAGNOSIS — M9903 Segmental and somatic dysfunction of lumbar region: Secondary | ICD-10-CM | POA: Diagnosis not present

## 2024-01-02 DIAGNOSIS — M5441 Lumbago with sciatica, right side: Secondary | ICD-10-CM | POA: Diagnosis not present

## 2024-01-02 DIAGNOSIS — M9901 Segmental and somatic dysfunction of cervical region: Secondary | ICD-10-CM | POA: Diagnosis not present

## 2024-01-02 DIAGNOSIS — M9902 Segmental and somatic dysfunction of thoracic region: Secondary | ICD-10-CM | POA: Diagnosis not present

## 2024-01-02 NOTE — Therapy (Unsigned)
OUTPATIENT PHYSICAL THERAPY THORACOLUMBAR EVALUATION   Patient Name: Nathan Kelly MRN: [id number] DOB:01/13/1966, 58 y.o., male Today's Date: 01/02/2024  END OF SESSION:   Past Medical History:  Diagnosis Date   Eczema 06/27/2022   Hyperglycemia 03/28/2022   Hyperlipidemia    Paronychia of finger of right hand 12/03/2022   No past surgical history on file. Patient Active Problem List   Diagnosis Date Noted   Hand dermatitis 01/01/2024   Spinal stenosis of lumbar region without neurogenic claudication 11/12/2023   Kidney lesion, native, left 10/11/2023   Aneurysm of ascending aorta without rupture (HCC) 09/17/2023   Chronic midline low back pain with right-sided sciatica 09/03/2023   Situational anxiety 09/03/2023   Statin medication declined by patient 02/26/2023   Paronychia of finger of right hand 12/03/2022   Eczema 06/27/2022   Primary hypertension 05/30/2022   Vitamin D deficiency 03/28/2022   Hyperlipidemia 03/28/2022   Acne vulgaris 03/28/2022   Overweight (BMI 25.0-29.9) 03/28/2022   Screen for colon cancer 12/29/2015    PCP: [name], MD   REFERRING PROVIDER: [name], MD   REFERRING DIAG: [phone] (ICD-10-CM) - Spinal stenosis of lumbar region without neurogenic claudication   Rationale for Evaluation and Treatment: Rehabilitation  THERAPY DIAG:  No diagnosis found.  ONSET DATE: 01/01/24  SUBJECTIVE:                                                                                                                                                                                           SUBJECTIVE STATEMENT: Patient reports H/O spinal stenosis, confirmed by MRI. He has tried chiropractic as well as PT, but has not had much relief. He reports that an Xray showed herniated discs in his back. His pain is worse first thing in the morning, and if he has been sitting.   PERTINENT HISTORY:  Stable Ascending Aortic Aneurysm, hyperlipidemia,  HTN Per referring provider: Ongoing back pain due to spinal stenosis; patient reports inadequate relief with previous physical therapy and chiropractic care  Provided referral for aquatic physical therapy to alleviate symptoms through reduced spinal load and improved mobility. Return to clinic if symptoms persist or escalate  PAIN:  Are you having pain? Yes: NPRS scale: 9/10 Pain location: low back, radiating into L leg Pain description: shooting into L leg Aggravating factors: Exercises from previous PT seemed to increase the pain sitting Relieving factors: Standing  PRECAUTIONS: Back and Fall  RED FLAGS: None   WEIGHT BEARING RESTRICTIONS: No  FALLS:  Has patient fallen in last 6 months? 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 His wife noticed that he stands in a weight shift onto RLE.   PLOF: Independent  PATIENT GOALS: Relieve back pain  NEXT MD VISIT: TBD  OBJECTIVE:  Note: Objective measures were completed at Evaluation unless otherwise noted.  DIAGNOSTIC FINDINGS:  Lumbar MRI 10/22/23 IMPRESSION: 1. Severe central canal stenosis at L3-4 with crowding of the nerve roots. 2. Moderate foraminal stenosis bilaterally at L3-4. 3. Moderate central canal stenosis at L4-5 with crowding of the nerve roots. Subarticular narrowing is worse right than left. Moderate right and mild left foraminal stenosis is present. 4. Mild right subarticular narrowing at L5-S1.  COGNITION: Overall cognitive status: Within functional limits for tasks assessed     SENSATION: Not tested  MUSCLE LENGTH: Hamstrings: Right 60 deg; Left 60 deg Thomas test: to neutral, feels tight B   POSTURE: decreased lumbar lordosis, decreased thoracic kyphosis, right pelvic obliquity, weight shift right, and lengthend trunk on R  PALPATION: No TTP along paraspinals in lumbar or sacral region, no TTP on SI areas or buttocks,  but mild TTP and tightness in B ITB.  LUMBAR ROM:   AROM eval  Flexion Mid shin, tight  Extension WNL  Right lateral flexion Mid thigh P  Left lateral flexion Mid thigh P  Right rotation 80%  Left rotation 80%   (Blank rows = not tested)  LOWER EXTREMITY ROM:   B SKTC WNL-tight, but no pain, otherwise WNL   LOWER EXTREMITY MMT:  5/5   LUMBAR SPECIAL TESTS:  Straight leg raise test: Negative, Slump test: Negative, FABER test: Negative, and Thomas test: Positive  GAIT: Distance walked: In clinic distances Comments: I and WNL, walks daily for exercise.  TREATMENT DATE:  01/03/24 Evaluation Education                                                                                                                            PATIENT EDUCATION:  Education details: POC Person educated: Patient Education method: Explanation Education comprehension: verbalized understanding  HOME EXERCISE PROGRAM: TBD  ASSESSMENT:  CLINICAL IMPRESSION: Patient is a 58 y.o. who was seen today for physical therapy evaluation and treatment for LBP with sciatica. He was referred for aquatic therapy, but at this point was not interested in this. His MRI shows mod-severe lumbar spinal stenosis and he reports that a Chiropractor showed him an X ray that shows herniated discs. He is quite focused on the discs, wanting to fix them. He tried PT previously and felt that the exercises exacerbated his pain. He stopped after 2 visits. He has seen the Chiropractor and feels the treatments give temporary relief, but no carryover. He has good strength, but spine and LE's appear stiff. His Thoracic and Lumbar curves are flattened. He also has questions regarding proper lifting. He will benefit from PT to address his stiffness and to try to identify any methods possible to relieve his pain so that he may avoid a surgical  consultation. Educated him that many people do find relief from PT for spinal stenosis, that we cannot  change the mechanical changes to his spine, but can help by improving his posture and flexibility.  OBJECTIVE IMPAIRMENTS: decreased ROM, impaired flexibility, and pain.   ACTIVITY LIMITATIONS: bending and sitting  PARTICIPATION LIMITATIONS: driving, community activity, and occupation  PERSONAL FACTORS: Past/current experiences are also affecting patient's functional outcome.   REHAB POTENTIAL: Good  CLINICAL DECISION MAKING: Stable/uncomplicated  EVALUATION COMPLEXITY: Moderate   GOALS: Goals reviewed with patient? Yes  SHORT TERM GOALS: Target date: 01/24/24  I with initial HEP Baseline: Goal status: INITIAL  LONG TERM GOALS: Target date: 03/27/24  I with final HEP Baseline:  Goal status: INITIAL  2.  Patient will return demonstrate proper lifting techniques to avoid back strain Baseline:  Goal status: INITIAL  3.  Patient will demonstrate full, pain free lumbar ROM in all planes Baseline:  Goal status: INITIAL  4.  Patient will tolerate his normal daily activities with LBP < 3/10 Baseline:  Goal status: INITIAL  5.  Identify any additional options to achieve pain relief, including TNS, self tractioning, invertion table Baseline:  Goal status: INITIAL  PLAN:  PT FREQUENCY: 1x/week  PT DURATION: 12 weeks  PLANNED INTERVENTIONS: 97110-Therapeutic exercises, 97530- Therapeutic activity, [id number]- Neuromuscular re-education, 97535- Self Care, 13244- Manual therapy, [id number]- Aquatic Therapy, 97014- Electrical stimulation (unattended), [phone]- Traction (mechanical), Patient/Family education, Dry Needling, Joint mobilization, Spinal mobilization, Cryotherapy, and Moist heat.  PLAN FOR NEXT SESSION: Possible traction, HEP, stretching in a manner which does not further strain his back   Iona Beard, DPT 01/02/2024, 4:20 PM

## 2024-01-03 ENCOUNTER — Ambulatory Visit: Payer: 59 | Attending: Family Medicine | Admitting: Physical Therapy

## 2024-01-03 ENCOUNTER — Encounter: Payer: Self-pay | Admitting: Physical Therapy

## 2024-01-03 DIAGNOSIS — R293 Abnormal posture: Secondary | ICD-10-CM | POA: Insufficient documentation

## 2024-01-03 DIAGNOSIS — M48061 Spinal stenosis, lumbar region without neurogenic claudication: Secondary | ICD-10-CM | POA: Insufficient documentation

## 2024-01-03 DIAGNOSIS — M5442 Lumbago with sciatica, left side: Secondary | ICD-10-CM | POA: Diagnosis not present

## 2024-01-03 DIAGNOSIS — M5441 Lumbago with sciatica, right side: Secondary | ICD-10-CM | POA: Diagnosis not present

## 2024-01-04 ENCOUNTER — Ambulatory Visit: Payer: 59 | Admitting: Physical Therapy

## 2024-01-04 ENCOUNTER — Encounter: Payer: Self-pay | Admitting: Physical Therapy

## 2024-01-04 DIAGNOSIS — M5442 Lumbago with sciatica, left side: Secondary | ICD-10-CM | POA: Diagnosis not present

## 2024-01-04 DIAGNOSIS — M5441 Lumbago with sciatica, right side: Secondary | ICD-10-CM | POA: Diagnosis not present

## 2024-01-04 DIAGNOSIS — R293 Abnormal posture: Secondary | ICD-10-CM

## 2024-01-04 DIAGNOSIS — M48061 Spinal stenosis, lumbar region without neurogenic claudication: Secondary | ICD-10-CM | POA: Diagnosis not present

## 2024-01-04 NOTE — Therapy (Signed)
OUTPATIENT PHYSICAL THERAPY THORACOLUMBAR EVALUATION   Patient Name: Nathan Kelly MRN: [id number] DOB:1966/07/24, 58 y.o., male Today's Date: 01/04/2024  END OF SESSION:  PT End of Session - 01/04/24 1106     Visit Number 2    Date for PT Re-Evaluation 03/27/24    PT Start Time 1106    PT Stop Time 1145    PT Time Calculation (min) 39 min    Activity Tolerance Patient tolerated treatment well    Behavior During Therapy [facility] for tasks assessed/performed             Past Medical History:  Diagnosis Date   Eczema 06/27/2022   Hyperglycemia 03/28/2022   Hyperlipidemia    Paronychia of finger of right hand 12/03/2022   History reviewed. No pertinent surgical history. Patient Active Problem List   Diagnosis Date Noted   Hand dermatitis 01/01/2024   Spinal stenosis of lumbar region without neurogenic claudication 11/12/2023   Kidney lesion, native, left 10/11/2023   Aneurysm of ascending aorta without rupture (HCC) 09/17/2023   Chronic midline low back pain with right-sided sciatica 09/03/2023   Situational anxiety 09/03/2023   Statin medication declined by patient 02/26/2023   Paronychia of finger of right hand 12/03/2022   Eczema 06/27/2022   Primary hypertension 05/30/2022   Vitamin D deficiency 03/28/2022   Hyperlipidemia 03/28/2022   Acne vulgaris 03/28/2022   Overweight (BMI 25.0-29.9) 03/28/2022   Screen for colon cancer 12/29/2015    PCP: [name], MD   REFERRING PROVIDER: [name], MD   REFERRING DIAG: [phone] (ICD-10-CM) - Spinal stenosis of lumbar region without neurogenic claudication   Rationale for Evaluation and Treatment: Rehabilitation  THERAPY DIAG:  Abnormal posture  Acute bilateral low back pain with bilateral sciatica  ONSET DATE: 01/01/24  SUBJECTIVE:                                                                                                                                                                                            SUBJECTIVE STATEMENT: Sciatica every morning.  Patient reports H/O spinal stenosis, confirmed by MRI. He has tried chiropractic as well as PT, but has not had much relief. He reports that an Xray showed herniated discs in his back. His pain is worse first thing in the morning, and if he has been sitting.   PERTINENT HISTORY:  Stable Ascending Aortic Aneurysm, hyperlipidemia, HTN Per referring provider: Ongoing back pain due to spinal stenosis; patient reports inadequate relief with previous physical therapy and chiropractic care  Provided referral for aquatic physical therapy to alleviate symptoms through reduced spinal load and improved mobility. Return  to clinic if symptoms persist or escalate  PAIN:  Are you having pain? Yes: NPRS scale: 4-5/10 Pain location: low back, radiating into L leg Pain description: shooting into L leg Aggravating factors: Exercises from previous PT seemed to increase the pain sitting Relieving factors: Standing  PRECAUTIONS: Back and Fall  RED FLAGS: None   WEIGHT BEARING RESTRICTIONS: No  FALLS:  Has patient fallen in last 6 months? 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 His wife noticed that he stands in a weight shift onto RLE.   PLOF: Independent  PATIENT GOALS: Relieve back pain  NEXT MD VISIT: TBD  OBJECTIVE:  Note: Objective measures were completed at Evaluation unless otherwise noted.  DIAGNOSTIC FINDINGS:  Lumbar MRI 10/22/23 IMPRESSION: 1. Severe central canal stenosis at L3-4 with crowding of the nerve roots. 2. Moderate foraminal stenosis bilaterally at L3-4. 3. Moderate central canal stenosis at L4-5 with crowding of the nerve roots. Subarticular narrowing is worse right than left. Moderate right and mild left foraminal stenosis is present. 4. Mild right subarticular narrowing at L5-S1.  COGNITION: Overall cognitive status: Within  functional limits for tasks assessed     SENSATION: Not tested  MUSCLE LENGTH: Hamstrings: Right 60 deg; Left 60 deg Thomas test: to neutral, feels tight B   POSTURE: decreased lumbar lordosis, decreased thoracic kyphosis, right pelvic obliquity, weight shift right, and lengthend trunk on R  PALPATION: No TTP along paraspinals in lumbar or sacral region, no TTP on SI areas or buttocks, but mild TTP and tightness in B ITB.  LUMBAR ROM:   AROM eval  Flexion Mid shin, tight  Extension WNL  Right lateral flexion Mid thigh P  Left lateral flexion Mid thigh P  Right rotation 80%  Left rotation 80%   (Blank rows = not tested)  LOWER EXTREMITY ROM:   B SKTC WNL-tight, but no pain, otherwise WNL   LOWER EXTREMITY MMT:  5/5   LUMBAR SPECIAL TESTS:  Straight leg raise test: Negative, Slump test: Negative, FABER test: Negative, and Thomas test: Positive  GAIT: Distance walked: In clinic distances Comments: I and WNL, walks daily for exercise.  TREATMENT DATE:  01/04/24 NuStep L 5 x 6 min Rows blue 2x10 Ext Blue x10 S2S OHP yellow ball x10  Stretches  Lower trunk, HS, Piriformis, ITB, Single knee to stretch   01/03/24 Evaluation Education                                                                                                                            PATIENT EDUCATION:  Education details: POC Person educated: Patient Education method: Explanation Education comprehension: verbalized understanding  HOME EXERCISE PROGRAM: Access Code: [email or web address] URL: [email or web address] Date: 01/04/2024 Prepared by: Debroah Baller  Exercises - Standing Row with Resistance  - 1 x daily - 7 x weekly - 3 sets - 10 reps - Standing Shoulder Extension with  Resistance  - 1 x daily - 7 x weekly - 3 sets - 10 reps - Seated Hamstring Stretch  - 1 x daily - 7 x weekly - 3 sets - 10 reps - Seated Piriformis Stretch  - 1 x daily - 7 x weekly - 3 sets - 10  reps  ASSESSMENT:  CLINICAL IMPRESSION: Patient is a 58 y.o. who was seen today for physical therapy treatment for LBP with sciatica. He has a history attending PT and chiropractor but with little relief. Time spent answering question about herniated vs bulging disk. Explained to pt how therapy could possibly help. Session consisted of light posterior chain strengthening of T and lumbar spine with some LE stretching. Pt is very tight in both LE. He did report an increase in L buttock pain with shoulder ext and with lower trunk rotations to his R side. He was able to compete all interventions.   OBJECTIVE IMPAIRMENTS: decreased ROM, impaired flexibility, and pain.   ACTIVITY LIMITATIONS: bending and sitting  PARTICIPATION LIMITATIONS: driving, community activity, and occupation  PERSONAL FACTORS: Past/current experiences are also affecting patient's functional outcome.   REHAB POTENTIAL: Good  CLINICAL DECISION MAKING: Stable/uncomplicated  EVALUATION COMPLEXITY: Moderate   GOALS: Goals reviewed with patient? Yes  SHORT TERM GOALS: Target date: 01/24/24  I with initial HEP Baseline: Goal status: INITIAL  LONG TERM GOALS: Target date: 03/27/24  I with final HEP Baseline:  Goal status: INITIAL  2.  Patient will return demonstrate proper lifting techniques to avoid back strain Baseline:  Goal status: INITIAL  3.  Patient will demonstrate full, pain free lumbar ROM in all planes Baseline:  Goal status: INITIAL  4.  Patient will tolerate his normal daily activities with LBP < 3/10 Baseline:  Goal status: INITIAL  5.  Identify any additional options to achieve pain relief, including TNS, self tractioning, invertion table Baseline:  Goal status: INITIAL  PLAN:  PT FREQUENCY: 1x/week  PT DURATION: 12 weeks  PLANNED INTERVENTIONS: 97110-Therapeutic exercises, 97530- Therapeutic activity, [id number]- Neuromuscular re-education, 97535- Self Care, 09811- Manual therapy, [id number]-  Aquatic Therapy, 97014- Electrical stimulation (unattended), [phone]- Traction (mechanical), Patient/Family education, Dry Needling, Joint mobilization, Spinal mobilization, Cryotherapy, and Moist heat.  PLAN FOR NEXT SESSION: Possible traction, HEP, stretching in a manner which does not further strain his back   Iona Beard, DPT 01/04/2024, 11:06 AM

## 2024-01-16 ENCOUNTER — Encounter: Payer: Self-pay | Admitting: Family Medicine

## 2024-01-16 DIAGNOSIS — E782 Mixed hyperlipidemia: Secondary | ICD-10-CM

## 2024-01-16 MED ORDER — ROSUVASTATIN CALCIUM 5 MG PO TABS: 5.0000 mg | ORAL_TABLET | Freq: Every day | ORAL | 0 refills | Status: DC

## 2024-02-11 ENCOUNTER — Encounter: Payer: 59 | Admitting: Family Medicine

## 2024-02-20 ENCOUNTER — Ambulatory Visit: Admitting: Physician Assistant

## 2024-02-20 ENCOUNTER — Encounter: Payer: Self-pay | Admitting: Family Medicine

## 2024-02-20 ENCOUNTER — Telehealth: Payer: Self-pay

## 2024-02-20 VITALS — BP 158/85 | HR 61 | Temp 98.7°F | Resp 16 | Ht 69.0 in | Wt 184.0 lb

## 2024-02-20 DIAGNOSIS — H9313 Tinnitus, bilateral: Secondary | ICD-10-CM | POA: Diagnosis not present

## 2024-02-20 MED ORDER — FLUTICASONE PROPIONATE 50 MCG/ACT NA SUSP: 2.0000 | Freq: Every day | NASAL | 0 refills | Status: DC

## 2024-02-20 MED ORDER — LORATADINE 10 MG PO TABS: 10.0000 mg | ORAL_TABLET | Freq: Every day | ORAL | 0 refills | Status: DC

## 2024-02-20 NOTE — Progress Notes (Signed)
 Established patient visit   Patient: Nathan Kelly   DOB: [birth year]   58 y.o. Male  MRN: [id number] Visit Date: 02/20/2024  Today's healthcare provider: Alfredia Ferguson, PA-C   Cc. Bilateral tinnitus  Subjective     Pt reports he was showering last night and noticed a high pitched ringing in both ears when he bent under the water. Denies ear pain, nasal congestion, dizziness, vision changes, headache. Reports a flight 3-4 weeks ago where the ringing occurred but self resolved.    Tinnitus persisted overnight.  He reports only new sensation is when he was hanging while exercising-- helps his stenosis-- his right index finger starting going numb and pale. Returned to normal.    Medications: Outpatient Medications Prior to Visit  Medication Sig   cholecalciferol (VITAMIN D3) 25 MCG (1000 UNIT) tablet Take 1,000 Units by mouth daily.   hydrocortisone 2.5 % cream APPLY TOPICALLY TWICE A DAY   Icosapent Ethyl 0.5 g CAPS Take 4 capsules (2 g total) by mouth 2 (two) times daily.   MAGNESIUM GLYCINATE PO Take by mouth.   nebivolol (BYSTOLIC) 2.5 MG tablet Take 1 tablet (2.5 mg total) by mouth daily.   rosuvastatin (CRESTOR) 5 MG tablet Take 1 tablet (5 mg total) by mouth daily.   No facility-administered medications prior to visit.    Review of Systems  Constitutional:  Negative for fatigue and fever.  HENT:  Positive for tinnitus.   Respiratory:  Negative for cough and shortness of breath.   Cardiovascular:  Negative for chest pain, palpitations and leg swelling.  Neurological:  Negative for dizziness and headaches.       Objective    BP (!) 158/85 (BP Location: Right Arm)   Pulse 61   Temp 98.7 F (37.1 C) (Oral)   Resp 16   Ht 5\' 9"  (1.753 m)   Wt 184 lb (83.5 kg)   SpO2 100%   BMI 27.17 kg/m    Physical Exam Vitals reviewed.  Constitutional:      Appearance: He is not ill-appearing.  HENT:     Head: Normocephalic.     Right Ear: Tympanic membrane normal.      Left Ear: Tympanic membrane normal.  Eyes:     Conjunctiva/sclera: Conjunctivae normal.  Cardiovascular:     Rate and Rhythm: Normal rate.  Pulmonary:     Effort: Pulmonary effort is normal. No respiratory distress.  Musculoskeletal:     Comments: ROM of neck with no acute numbness/tingling, arms raised above head with no numbness/tingling  Neurological:     Mental Status: He is alert and oriented to person, place, and time.  Psychiatric:        Mood and Affect: Mood normal.        Behavior: Behavior normal.      No results found for any visits on 02/20/24.  Assessment & Plan    Bilateral tinnitus -     Loratadine; Take 1 tablet (10 mg total) by mouth daily.  Dispense: 30 tablet; Refill: 0 -     Fluticasone Propionate; Place 2 sprays into both nostrils daily.  Dispense: 16 g; Refill: 0 -     Ambulatory referral to ENT  Recommending flonase and claritin, referring to ENT per pt preference.  No acoustic trauma, recent flight 3-4 weeks ago  Return if symptoms worsen or fail to improve.       Alfredia Ferguson, PA-C  [facility] at [facility] [phone]  phone) [phone] (fax)  [facility]

## 2024-02-20 NOTE — Telephone Encounter (Signed)
 Copied from CRM [id number]. Topic: Appointments - Appointment Scheduling >> Feb 20, 2024  8:01 AM Alcus Dad wrote: Patient/patient representative is calling to schedule an appointment. Patient wants nurse to give him a call

## 2024-02-20 NOTE — Telephone Encounter (Signed)
 Spoke with patient regarding mychart message about high pitched ringing. Patient asked for a sooner appointment. I advised him we are booked at our clinic today, but we had availibility at [facility] at 1:20pm today. He agreed.

## 2024-02-22 ENCOUNTER — Ambulatory Visit: Admitting: Family Medicine

## 2024-02-25 ENCOUNTER — Ambulatory Visit

## 2024-02-25 ENCOUNTER — Other Ambulatory Visit (INDEPENDENT_AMBULATORY_CARE_PROVIDER_SITE_OTHER): Payer: 59

## 2024-02-25 ENCOUNTER — Other Ambulatory Visit: Payer: Self-pay | Admitting: Family Medicine

## 2024-02-25 ENCOUNTER — Telehealth: Payer: Self-pay

## 2024-02-25 ENCOUNTER — Ambulatory Visit (INDEPENDENT_AMBULATORY_CARE_PROVIDER_SITE_OTHER)

## 2024-02-25 DIAGNOSIS — R7303 Prediabetes: Secondary | ICD-10-CM

## 2024-02-25 DIAGNOSIS — Z0000 Encounter for general adult medical examination without abnormal findings: Secondary | ICD-10-CM | POA: Diagnosis not present

## 2024-02-25 DIAGNOSIS — R739 Hyperglycemia, unspecified: Secondary | ICD-10-CM | POA: Diagnosis not present

## 2024-02-25 DIAGNOSIS — Z125 Encounter for screening for malignant neoplasm of prostate: Secondary | ICD-10-CM

## 2024-02-25 LAB — LIPID PANEL
Cholesterol: 164 mg/dL (ref 0–200)
HDL: 50 mg/dL (ref >39.00)
LDL Cholesterol: 88 mg/dL (ref 0–99)
NonHDL: 113.57
Total CHOL/HDL Ratio: 3
Triglycerides: 127 mg/dL (ref 0.0–149.0)
VLDL: 25.4 mg/dL (ref 0.0–40.0)

## 2024-02-25 LAB — COMPREHENSIVE METABOLIC PANEL
ALT: 26 U/L (ref 0–53)
AST: 21 U/L (ref 0–37)
Albumin: 4 g/dL (ref 3.5–5.2)
Alkaline Phosphatase: 73 U/L (ref 39–117)
BUN: 15 mg/dL (ref 6–23)
CO2: 29 meq/L (ref 19–32)
Calcium: 8.8 mg/dL (ref 8.4–10.5)
Chloride: 106 meq/L (ref 96–112)
Creatinine, Ser: 0.93 mg/dL (ref 0.40–1.50)
GFR: 91.08 mL/min (ref >60.00)
Glucose, Bld: 105 mg/dL — ABNORMAL HIGH (ref 70–99)
Potassium: 3.9 meq/L (ref 3.5–5.1)
Sodium: 141 meq/L (ref 135–145)
Total Bilirubin: 0.7 mg/dL (ref 0.2–1.2)
Total Protein: 6.4 g/dL (ref 6.0–8.3)

## 2024-02-25 LAB — CBC WITH DIFFERENTIAL/PLATELET
Basophils Absolute: 0 10*3/uL (ref 0.0–0.1)
Basophils Relative: 0.7 % (ref 0.0–3.0)
Eosinophils Absolute: 0.2 10*3/uL (ref 0.0–0.7)
Eosinophils Relative: 3 % (ref 0.0–5.0)
HCT: 43.1 % (ref 39.0–52.0)
Hemoglobin: 14.6 g/dL (ref 13.0–17.0)
Lymphocytes Relative: 28.2 % (ref 12.0–46.0)
Lymphs Abs: 1.5 10*3/uL (ref 0.7–4.0)
MCHC: 33.8 g/dL (ref 30.0–36.0)
MCV: 85.8 fl (ref 78.0–100.0)
Monocytes Absolute: 0.4 10*3/uL (ref 0.1–1.0)
Monocytes Relative: 8 % (ref 3.0–12.0)
Neutro Abs: 3.1 10*3/uL (ref 1.4–7.7)
Neutrophils Relative %: 60.1 % (ref 43.0–77.0)
Platelets: 192 10*3/uL (ref 150.0–400.0)
RBC: 5.02 Mil/uL (ref 4.22–5.81)
RDW: 12.8 % (ref 11.5–15.5)
WBC: 5.2 10*3/uL (ref 4.0–10.5)

## 2024-02-25 LAB — TSH: TSH: 1.65 u[IU]/mL (ref 0.35–5.50)

## 2024-02-25 LAB — PSA: PSA: 0.74 ng/mL (ref 0.10–4.00)

## 2024-02-25 NOTE — Progress Notes (Signed)
 Patient arrived this morning for fasting lab appointment for his CPE on 03/03/2024. CPE Male fasting labs ordered per PCP OV note on 11/12/2023.   Return in about 3 months (around 02/10/2024) for HLD, BP, physical (fasting labs)

## 2024-02-25 NOTE — Telephone Encounter (Signed)
 Order request faxed to [facility] lab.

## 2024-02-25 NOTE — Telephone Encounter (Signed)
-----   Message from [name] sent at 02/25/2024  3:09 PM EDT ----- Can we please add on an HgA1C for prediabetes? ----- Message ----- From: Interface, Lab In Three Zero One Sent: 02/25/2024   2:08 PM EDT To: [name], MD

## 2024-02-26 ENCOUNTER — Encounter: Payer: Self-pay | Admitting: Family Medicine

## 2024-02-26 LAB — HEMOGLOBIN A1C: Hgb A1c MFr Bld: 6.2 % (ref 4.6–6.5)

## 2024-02-28 ENCOUNTER — Telehealth (INDEPENDENT_AMBULATORY_CARE_PROVIDER_SITE_OTHER): Payer: Self-pay | Admitting: Audiology

## 2024-02-28 ENCOUNTER — Telehealth (INDEPENDENT_AMBULATORY_CARE_PROVIDER_SITE_OTHER): Payer: Self-pay | Admitting: Physician Assistant

## 2024-02-28 NOTE — Telephone Encounter (Signed)
 Reminder Call:  Date: 02/29/2024 Status: Sch  Time: 9:00 AM Confirmed time and location-[address] [place], [place] [address]

## 2024-02-28 NOTE — Telephone Encounter (Signed)
 Reminder Call:  Date: 02/29/2024 Status: Sch  Time: 9:30 AM Confirmed time and location-[address] [place], [place] [address]

## 2024-02-29 ENCOUNTER — Encounter (INDEPENDENT_AMBULATORY_CARE_PROVIDER_SITE_OTHER): Payer: Self-pay

## 2024-02-29 ENCOUNTER — Ambulatory Visit (INDEPENDENT_AMBULATORY_CARE_PROVIDER_SITE_OTHER): Admitting: Audiology

## 2024-02-29 ENCOUNTER — Ambulatory Visit (INDEPENDENT_AMBULATORY_CARE_PROVIDER_SITE_OTHER): Admitting: Physician Assistant

## 2024-02-29 VITALS — BP 157/97 | HR 66 | Ht 69.0 in | Wt 178.0 lb

## 2024-02-29 DIAGNOSIS — H903 Sensorineural hearing loss, bilateral: Secondary | ICD-10-CM

## 2024-02-29 DIAGNOSIS — H9313 Tinnitus, bilateral: Secondary | ICD-10-CM | POA: Diagnosis not present

## 2024-02-29 NOTE — Progress Notes (Signed)
 Dear Dr. Ok Edwards, Here is my assessment for our mutual patient, Nathan Kelly. Thank you for allowing me the opportunity to care for your patient. Please do not hesitate to contact me should you have any other questions. Sincerely, Burna Forts PA-C  Otolaryngology Clinic Note Referring provider: Dr. Ok Edwards HPI:  Nathan Kelly is a 58 y.o. male kindly referred by Dr. Ok Edwards   The patient is a 58 year old gentleman presenting today with tinnitus.  The patient notes that approximately 1 week ago he started having buzzing sound in the bilateral ears.  He notes this started after taking a shower.  He notes it is high-pitched and consistent.  He notes the symptoms originally presented briefly approximately 5 to 6 weeks ago after riding on an airplane.  He notes that the only lasted approximately 15 minutes and completely resolved.  He notes the present symptoms are not companied by any of the following including hearing loss, dizziness, neurologic deficits, ear pain, drainage.  He denies any history of ear problems or hearing loss.  He denies any head or neck surgeries, no head or neck trauma.  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  No new medications.  He has had increased stress lately.  He reports it does not keep him up at night and does not affect his quality of life at this point. He is a former smoker.   H&N Surgery: no   Independent Review of Additional Tests or Records:    [address], [address] [place], [place] [address] [phone]   Audiological Evaluation      Name: Orlen Leedy                                        DOB:   January 18, 1966                                                MRN:   [id number]                                                                                     Service Date: 02/29/2024                                              Accompanied by: unaccompanied    Patient comes today after [name], PA-C sent a referral for a hearing  evaluation due to concerns with tinnitus.   Symptoms Yes Details  Hearing loss  []  Not perceived  Tinnitus  [x]  Bilateral buzzing with onset 1 week ago  Ear pain/ infections/pressure  []     Balance problems  [x]  Some balance problems- denies spinning  Noise exposure history  [x]  Just music   Previous ear surgeries  []     Family history of hearing loss  []   Amplification  []     Other  []         Otoscopy: Right ear: Clear external ear canals and notable landmarks visualized on the tympanic membrane. Left ear:  Clear external ear canals and notable landmarks visualized on the tympanic membrane.   Tympanometry: Right ear: Type A- Normal external ear canal volume with normal middle ear pressure and tympanic membrane compliance Left ear: Type A- Normal external ear canal volume with normal middle ear pressure and tympanic membrane compliance     Pure tone Audiometry: Both ears- Normal hearing from [phone], then moderate to moderately severe sensorineural hearing loss notch from 3000-8000 Hz .   Speech Audiometry: Right ear- Speech Reception Threshold (SRT) was obtained at 15 dBHL. Left ear-Speech Reception Threshold (SRT) was obtained at 10 dBHL.   Word Recognition Score Tested using NU-6 (MLV) Right ear: 92% was obtained at a presentation level of 70 dBHL with contralateral masking which is deemed as  excellent. Left ear: 92% was obtained at a presentation level of 70 dBHL with contralateral masking which is deemed as  excellent.   The hearing test results were completed under headphones and results are deemed to be of good reliability. Test technique:  conventional         Sensorineural hearing loss  PMH/Meds/All/SocHx/FamHx/ROS:   Past Medical History:  Diagnosis Date   Eczema 06/27/2022   Hyperglycemia 03/28/2022   Hyperlipidemia    Paronychia of finger of right hand 12/03/2022     History reviewed. No pertinent surgical history.  Family History  Problem  Relation Age of Onset   Diabetes Mother    Hypertension Mother    Hypertension Daughter      Social Connections: Socially Isolated (02/20/2024)   Social Connection and Isolation Panel [NHANES]    Frequency of Communication with Friends and Family: Once a week    Frequency of Social Gatherings with Friends and Family: Once a week    Attends Religious Services: Never    Database administrator or Organizations: No    Attends Engineer, structural: Not on file    Marital Status: Married      Current Outpatient Medications:    cholecalciferol (VITAMIN D3) 25 MCG (1000 UNIT) tablet, Take 1,000 Units by mouth daily., Disp: , Rfl:    fluticasone (FLONASE) 50 MCG/ACT nasal spray, Place 2 sprays into both nostrils daily., Disp: 16 g, Rfl: 0   hydrocortisone 2.5 % cream, APPLY TOPICALLY TWICE A DAY, Disp: 28 g, Rfl: 0   loratadine (CLARITIN) 10 MG tablet, Take 1 tablet (10 mg total) by mouth daily., Disp: 30 tablet, Rfl: 0   MAGNESIUM GLYCINATE PO, Take by mouth., Disp: , Rfl:    nebivolol (BYSTOLIC) 2.5 MG tablet, Take 1 tablet (2.5 mg total) by mouth daily., Disp: 90 tablet, Rfl: 3   rosuvastatin (CRESTOR) 5 MG tablet, Take 1 tablet (5 mg total) by mouth daily., Disp: 90 tablet, Rfl: 0   Icosapent Ethyl 0.5 g CAPS, Take 4 capsules (2 g total) by mouth 2 (two) times daily., Disp: 720 capsule, Rfl: 3   Physical Exam:   BP (!) 157/97 (BP Location: Left Arm, Patient Position: Sitting, Cuff Size: Normal)   Pulse 66   Ht 5\' 9"  (1.753 m)   Wt 178 lb (80.7 kg)   SpO2 98%   BMI 26.29 kg/m   Pertinent Findings  CN II-XII intact  Bilateral EAC clear and TM intact with well pneumatized middle ear spaces Weber 512: equal  Rinne 512: AC > BC b/l  Anterior rhinoscopy: Septum midline No lesions of oral cavity/oropharynx; dentition WNL No obviously palpable neck masses/lymphadenopathy/thyromegaly No respiratory distress or stridor  Seprately Identifiable Procedures:  None  Impression &  Plans:  Nathan Kelly is a 58 y.o. male with the following   Tinnitus-  58 year old male presenting today with tinnitus.  No red flags on exam.  This has been 2 weeks in duration.  He has sensorineural hearing loss which he does not perceive.  I have offered for him to follow-up with the [facility] tinnitus clinic for further discussion.  I have counseled him on noise masking.  I have also counseled him on return precautions in the event he develops any concerning signs or symptoms.  The patient verbalized understanding and agreement to today's plan had no further questions or concerns.   - f/u [facility]- PRN with me   Thank you for allowing me the opportunity to care for your patient. Please do not hesitate to contact me should you have any other questions.  Sincerely, Burna Forts PA-C [facility] Phone: [phone] Fax: [phone]  02/29/2024, 9:58 AM

## 2024-02-29 NOTE — Progress Notes (Signed)
  [address] [place], [place] [address] [phone]  Audiological Evaluation    Name: Nathan Kelly     DOB:   [birth year]      MRN:   [id number]                                                                                     Service Date: 02/29/2024     Accompanied by: unaccompanied    Patient comes today after Nathan Mechanic, PA-C sent a referral for a hearing evaluation due to concerns with tinnitus.  Symptoms Yes Details  Hearing loss  []  Not perceived  Tinnitus  [x]  Bilateral buzzing with onset 1 week ago  Ear pain/ infections/pressure  []    Balance problems  [x]  Some balance problems- denies spinning  Noise exposure history  [x]  Just music   Previous ear surgeries  []    Family history of hearing loss  []    Amplification  []    Other  []      Otoscopy: Right ear: Clear external ear canals and notable landmarks visualized on the tympanic membrane. Left ear:  Clear external ear canals and notable landmarks visualized on the tympanic membrane.  Tympanometry: Right ear: Type A- Normal external ear canal volume with normal middle ear pressure and tympanic membrane compliance Left ear: Type A- Normal external ear canal volume with normal middle ear pressure and tympanic membrane compliance   Pure tone Audiometry: Both ears- Normal hearing from [phone] Hz, then moderate to moderately severe sensorineural hearing loss notch from 3000-8000 Hz .  Speech Audiometry: Right ear- Speech Reception Threshold (SRT) was obtained at 15 dBHL. Left ear-Speech Reception Threshold (SRT) was obtained at 10 dBHL.   Word Recognition Score Tested using NU-6 (MLV) Right ear: 92% was obtained at a presentation level of 70 dBHL with contralateral masking which is deemed as  excellent. Left ear: 92% was obtained at a presentation level of 70 dBHL with contralateral masking which is deemed as  excellent.   The hearing test results were completed under headphones and results are  deemed to be of good reliability. Test technique:  conventional     Recommendations: Follow up with ENT as scheduled for today. Return for a hearing evaluation if concerns with hearing changes arise or per MD recommendation. Use hearing protection when exposed to loud/damaging sounds.  Consider various tinnitus strategies, including the use of a sound generator, and/or tinnitus retraining therapy. Patient may consider going to [facility]'s tinnitus clinic. Consider a communication needs assessment after medical clearance for hearing aids is obtained, pending patient motivation.   Nathan Kelly Nathan Kelly, AUD

## 2024-02-29 NOTE — Patient Instructions (Addendum)
 Ear Center: [facility]-  [address]    [address]    [place] [address]    map [phone]  Fax [phone]

## 2024-03-03 ENCOUNTER — Ambulatory Visit (INDEPENDENT_AMBULATORY_CARE_PROVIDER_SITE_OTHER): Payer: 59 | Admitting: Family Medicine

## 2024-03-03 ENCOUNTER — Encounter: Payer: 59 | Admitting: Family Medicine

## 2024-03-03 VITALS — BP 142/84 | HR 80 | Temp 97.0°F | Ht 69.0 in | Wt 178.2 lb

## 2024-03-03 DIAGNOSIS — R683 Clubbing of fingers: Secondary | ICD-10-CM | POA: Insufficient documentation

## 2024-03-03 DIAGNOSIS — I7121 Aneurysm of the ascending aorta, without rupture: Secondary | ICD-10-CM | POA: Diagnosis not present

## 2024-03-03 DIAGNOSIS — I7300 Raynaud's syndrome without gangrene: Secondary | ICD-10-CM | POA: Insufficient documentation

## 2024-03-03 DIAGNOSIS — E782 Mixed hyperlipidemia: Secondary | ICD-10-CM

## 2024-03-03 DIAGNOSIS — L2084 Intrinsic (allergic) eczema: Secondary | ICD-10-CM

## 2024-03-03 DIAGNOSIS — H9313 Tinnitus, bilateral: Secondary | ICD-10-CM | POA: Diagnosis not present

## 2024-03-03 DIAGNOSIS — Z0000 Encounter for general adult medical examination without abnormal findings: Secondary | ICD-10-CM | POA: Diagnosis not present

## 2024-03-03 DIAGNOSIS — R7303 Prediabetes: Secondary | ICD-10-CM | POA: Insufficient documentation

## 2024-03-03 DIAGNOSIS — I10 Essential (primary) hypertension: Secondary | ICD-10-CM

## 2024-03-03 DIAGNOSIS — L700 Acne vulgaris: Secondary | ICD-10-CM

## 2024-03-03 LAB — C-REACTIVE PROTEIN: CRP: <1 mg/dL (ref 0.5–20.0)

## 2024-03-03 LAB — SEDIMENTATION RATE: Sed Rate: <1 mm/h (ref 0–20)

## 2024-03-03 NOTE — Progress Notes (Signed)
 Assessment  Assessment/Plan:  Assessment and Plan Assessment & Plan Raynaud's phenomenon Intermittent blanching, numbness, and cold sensation in the fingertips, likely triggered by cold exposure. Symptoms have been present for 1-2 months. Differential includes possible underlying autoimmune disease, though most cases are idiopathic. Symptoms resolve with warming and movement. - Order ANA, ESR, and CRP to assess for underlying autoimmune disease - Refer to dermatology for evaluation of nail changes and possible nail fold capillaroscopy  Tinnitus Persistent tinnitus for approximately two weeks, with no clear association with current medications. ENT follow-up is ongoing. He plans to contact [facility] tinnitus clinic for further evaluation. - Continue follow-up with ENT - Contact [facility] tinnitus clinic for further evaluation  Hypertension Blood pressure is elevated, with home readings around 150 mmHg systolic. Blood pressure control is important for managing aortic aneurysm risk. He had stopped nebivolol temporarily to assess its effect on tinnitus, but no improvement was noted. - Recheck blood pressure during the visit - Restart nebivolol 2.5 mg daily to manage blood pressure and prevent aortic aneurysm progression  Aortic aneurysm Aortic aneurysm requires careful management of blood pressure and cholesterol to prevent progression. He is advised to avoid heavy lifting over 20-30 pounds and continue cardiovascular exercise. - Continue rosuvastatin 5 mg daily for cholesterol management - Encourage cardiovascular exercise, such as running, but avoid heavy lifting over 20-30 pounds  Prediabetes Blood sugar levels are elevated but not yet in the diabetic range. He has made dietary changes to reduce sugar intake, including stopping coffee with sugar. - Recheck blood sugar in 3 months - Encourage continued dietary modifications to manage blood sugar levels     There are no discontinued  medications.  Patient Counseling(The following topics were reviewed and/or handout was given):  -Nutrition: Stressed importance of moderation in sodium/caffeine intake, saturated fat and cholesterol, caloric balance, sufficient intake of fresh fruits, vegetables, and fiber.  -Stressed the importance of regular exercise.   -Substance Abuse: Discussed cessation/primary prevention of tobacco, alcohol, or other drug use; driving or other dangerous activities under the influence; availability of treatment for abuse.   -Injury prevention: Discussed safety belts, safety helmets, smoke detector, smoking near bedding or upholstery.   -Sexuality: Discussed sexually transmitted diseases, partner selection, use of condoms, avoidance of unintended pregnancy and contraceptive alternatives.   -Dental health: Discussed importance of regular tooth brushing, flossing, and dental visits.  -Health maintenance and immunizations reviewed. Please refer to Health maintenance section.  Return in about 3 months (around 06/03/2024) for prediabetes, fasting labs 1 week before next appointment.        Subjective:   Encounter date: 03/03/2024  Chief Complaint  Patient presents with   Annual Exam    Discussed the use of AI scribe software for clinical note transcription with the patient, who gave verbal consent to proceed.  History of Present Illness Nathan Kelly is a 58 year old male with hypertension and hyperlipidemia who presents for an annual physical exam.  He has stopped taking nebivolol 2.5 mg daily for the past three to four days to assess its impact on his tinnitus, but the tinnitus persists. His blood pressure at home tends to run around 150 mmHg, better in the morning compared to later in the day. He is also on rosuvastatin 5 mg daily, fish oil, and magnesium.  He has been experiencing tinnitus for almost two weeks, with the sound becoming softer over the last two days but still present. He is following  up with ENT for this issue and plans  to contact the [facility] tinnitus clinic.  He describes episodes of his fingers turning very white and numb, particularly in cold conditions, occurring for the past one to two months. Initially thought to be an allergic reaction to soap, the symptoms persisted even after discontinuing the soap. The episodes occur intermittently and resolve with warming and movement. He has not experienced this before and notes that the symptoms are not worsening.  He mentions having prediabetes with somewhat elevated blood sugar levels, but not yet diabetic. He has stopped consuming coffee with sugar since his last lab work. Excessive sugar intake seems to cause acne-like bumps on his skin.  No chest pain, shortness of breath, abdominal pain, urinary symptoms, headaches, or blurry vision. He occasionally experiences bumps outside the ear drum that resolve after a few days. He has a history of eczema, dry skin, and acne.       03/03/2024    9:19 AM 09/03/2023    9:53 AM 05/30/2022    2:38 PM 03/28/2022    9:07 AM 12/14/2020    9:22 AM  Depression screen PHQ 2/9  Decreased Interest 0 0 0 0 0  Down, Depressed, Hopeless 0 0 0 0 0  PHQ - 2 Score 0 0 0 0 0  Altered sleeping 0 0     Tired, decreased energy 0 0     Change in appetite 0 0     Feeling bad or failure about yourself  0 0     Trouble concentrating 0 0     Moving slowly or fidgety/restless 0 0     Suicidal thoughts 0 0     PHQ-9 Score 0 0     Difficult doing work/chores Not difficult at all Not difficult at all          03/03/2024    9:20 AM 09/03/2023    9:53 AM  GAD 7 : Generalized Anxiety Score  Nervous, Anxious, on Edge 1 0  Control/stop worrying 1 0  Worry too much - different things 0 0  Trouble relaxing 0 0  Restless 0 0  Easily annoyed or irritable 0 0  Afraid - awful might happen 0 0  Total GAD 7 Score 2 0  Anxiety Difficulty Not difficult at all Not difficult at all    There are no preventive care  reminders to display for this patient.     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  Family History  Problem Relation Age of Onset  Diabetes Mother    Hypertension Mother    Hypertension Daughter     Medications- reviewed and updated Outpatient Medications Prior to Visit  Medication Sig Dispense Refill   hydrocortisone 2.5 % cream APPLY TOPICALLY TWICE A DAY 28 g 0   MAGNESIUM GLYCINATE PO Take by mouth.     rosuvastatin (CRESTOR) 5 MG tablet Take 1 tablet (5 mg total) by mouth daily. 90 tablet 0   cholecalciferol (VITAMIN D3) 25 MCG (1000 UNIT) tablet Take 1,000 Units by mouth daily.     fluticasone (FLONASE) 50 MCG/ACT nasal spray Place 2 sprays into both nostrils daily. (Patient not taking: Reported on 03/03/2024) 16 g 0   Icosapent Ethyl 0.5 g CAPS Take 4 capsules (2 g total) by mouth 2 (two) times daily. 720 capsule 3   loratadine (CLARITIN) 10 MG tablet Take 1 tablet (10 mg  total) by mouth daily. (Patient not taking: Reported on 03/03/2024) 30 tablet 0   nebivolol (BYSTOLIC) 2.5 MG tablet Take 1 tablet (2.5 mg total) by mouth daily. (Patient not taking: Reported on 03/03/2024) 90 tablet 3   No facility-administered medications prior to visit.    Allergies  Allergen Reactions   Ciprofloxacin     Patient has aortic aneurysm.  Potential rupture with fluoroquinolones.    Social History   Socioeconomic History   Marital status: Married    Spouse name: [name]   Number of children: 3   Years of education: Not on file   Highest education level: Associate degree: occupational, Scientist, product/process development, or vocational program  Occupational History   Occupation: Advertising account planner    Comment: [employer]  Tobacco Use   Smoking status: Former    Current packs/day: 0.00    Average packs/day: 0.5 packs/day for 30.0 years (15.0 ttl pk-yrs)    Types: Cigarettes    Start date: 12/11/1978    Quit date: 12/11/2008    Years since quitting: 15.2   Smokeless tobacco: Never  Vaping Use   Vaping status: Never Used  Substance and Sexual Activity   Alcohol use: No    Alcohol/week: 0.0 standard drinks of alcohol   Drug use: No   Sexual activity: Yes    Partners: Female    Birth control/protection: Surgical  Other Topics Concern   Not on file  Social History Narrative   Moved to [place] in 1989 from [place]   Lives with wife and 3 daughters      Wears seatbelt 100%   Guns in home - yes secured   Social Drivers of Health   Financial Resource Strain: Low Risk  (02/20/2024)   Overall Financial Resource Strain (CARDIA)    Difficulty of Paying Living Expenses: Not hard at all  Food Insecurity: No Food Insecurity (02/20/2024)   Hunger Vital Sign    Worried About Running Out of Food in the Last Year: Never true    Ran Out of Food in the Last Year: Never true  Transportation Needs: No Transportation Needs (02/20/2024)   PRAPARE - Administrator, Civil Service (Medical): No     Lack of Transportation (Non-Medical): No  Physical Activity: Sufficiently Active (02/20/2024)   Exercise Vital Sign    Days of Exercise per Week: 7 days    Minutes of Exercise per Session: 30 min  Stress: Stress Concern Present (02/20/2024)   [employer] of Occupational Health - Occupational Stress Questionnaire    Feeling of Stress : To some extent  Social Connections: Socially Isolated (02/20/2024)   Social  Connection and Isolation Panel [NHANES]    Frequency of Communication with Friends and Family: Once a week    Frequency of Social Gatherings with Friends and Family: Once a week    Attends Religious Services: Never    Database administrator or Organizations: No    Attends Engineer, structural: Not on file    Marital Status: Married           Objective:  Physical Exam: BP (!) 142/84   Pulse 80   Temp (!) 97 F (36.1 C) (Temporal)   Ht 5\' 9"  (1.753 m)   Wt 178 lb 3.2 oz (80.8 kg)   SpO2 100%   BMI 26.32 kg/m   Body mass index is 26.32 kg/m. Wt Readings from Last 3 Encounters:  03/03/24 178 lb 3.2 oz (80.8 kg)  02/29/24 178 lb (80.7 kg)  02/20/24 184 lb (83.5 kg)    Physical Exam VITALS: BP- 150/  Physical Exam Constitutional:      General: He is not in acute distress.    Appearance: Normal appearance. He is not ill-appearing or toxic-appearing.  HENT:     Head: Normocephalic and atraumatic.     Right Ear: Hearing, tympanic membrane, ear canal and external ear normal. There is no impacted cerumen.     Left Ear: Hearing, tympanic membrane, ear canal and external ear normal. There is no impacted cerumen.     Nose: Nose normal. No congestion.     Mouth/Throat:     Lips: No lesions.     Mouth: Mucous membranes are moist.     Pharynx: Oropharynx is clear. No oropharyngeal exudate.  Eyes:     General: No scleral icterus.       Right eye: No discharge.        Left eye: No discharge.     Conjunctiva/sclera: Conjunctivae normal.     Pupils: Pupils  are equal, round, and reactive to light.  Neck:     Thyroid: No thyroid mass, thyromegaly or thyroid tenderness.  Cardiovascular:     Rate and Rhythm: Normal rate and regular rhythm.     Pulses: Normal pulses.     Heart sounds: Normal heart sounds.  Pulmonary:     Effort: Pulmonary effort is normal. No respiratory distress.     Breath sounds: Normal breath sounds.  Abdominal:     General: Abdomen is flat. Bowel sounds are normal.     Palpations: Abdomen is soft.  Musculoskeletal:        General: Normal range of motion.     Cervical back: Normal range of motion.     Right lower leg: No edema.     Left lower leg: No edema.  Lymphadenopathy:     Cervical: No cervical adenopathy.  Skin:    General: Skin is warm and dry.     Capillary Refill: Capillary refill takes less than 2 seconds.     Findings: Acne (face) and rash (excema on hands) present.     Nails: There is clubbing.  Neurological:     General: No focal deficit present.     Mental Status: He is alert and oriented to person, place, and time. Mental status is at baseline.     Deep Tendon Reflexes:     Reflex Scores:      Patellar reflexes are 2+ on the right side and 2+ on the left side. Psychiatric:        Mood and Affect: Mood normal.  Behavior: Behavior normal.        Thought Content: Thought content normal.        Judgment: Judgment normal.         Prior labs:   Recent Results (from the past 2160 hours)  CBC with Differential/Platelet     Status: None   Collection Time: 02/25/24  9:21 AM  Result Value Ref Range   WBC 5.2 4.0 - 10.5 K/uL   RBC 5.02 4.22 - 5.81 Mil/uL   Hemoglobin 14.6 13.0 - 17.0 g/dL   HCT 09.8 11.9 - 14.7 %   MCV 85.8 78.0 - 100.0 fl   MCHC 33.8 30.0 - 36.0 g/dL   RDW 82.9 56.2 - 13.0 %   Platelets 192.0 150.0 - 400.0 K/uL   Neutrophils Relative % 60.1 43.0 - 77.0 %   Lymphocytes Relative 28.2 12.0 - 46.0 %   Monocytes Relative 8.0 3.0 - 12.0 %   Eosinophils Relative 3.0 0.0 -  5.0 %   Basophils Relative 0.7 0.0 - 3.0 %   Neutro Abs 3.1 1.4 - 7.7 K/uL   Lymphs Abs 1.5 0.7 - 4.0 K/uL   Monocytes Absolute 0.4 0.1 - 1.0 K/uL   Eosinophils Absolute 0.2 0.0 - 0.7 K/uL   Basophils Absolute 0.0 0.0 - 0.1 K/uL  Comprehensive metabolic panel     Status: Abnormal   Collection Time: 02/25/24  9:21 AM  Result Value Ref Range   Sodium 141 135 - 145 mEq/L   Potassium 3.9 3.5 - 5.1 mEq/L   Chloride 106 96 - 112 mEq/L   CO2 29 19 - 32 mEq/L   Glucose, Bld 105 (H) 70 - 99 mg/dL   BUN 15 6 - 23 mg/dL   Creatinine, Ser 8.65 0.40 - 1.50 mg/dL   Total Bilirubin 0.7 0.2 - 1.2 mg/dL   Alkaline Phosphatase 73 39 - 117 U/L   AST 21 0 - 37 U/L   ALT 26 0 - 53 U/L   Total Protein 6.4 6.0 - 8.3 g/dL   Albumin 4.0 3.5 - 5.2 g/dL   GFR 78.46 >96.29 mL/min    Comment: Calculated using the CKD-EPI Creatinine Equation (2021)   Calcium 8.8 8.4 - 10.5 mg/dL  Lipid panel     Status: None   Collection Time: 02/25/24  9:21 AM  Result Value Ref Range   Cholesterol 164 0 - 200 mg/dL    Comment: ATP III Classification       Desirable:  < 200 mg/dL               Borderline High:  200 - 239 mg/dL          High:  > = 528 mg/dL   Triglycerides 413.2 0.0 - 149.0 mg/dL    Comment: Normal:  <440 mg/dLBorderline High:  150 - 199 mg/dL   HDL 10.27 >25.36 mg/dL   VLDL 64.4 0.0 - 03.4 mg/dL   LDL Cholesterol 88 0 - 99 mg/dL   Total CHOL/HDL Ratio 3     Comment:                Men          Women1/2 Average Risk     3.4          3.3Average Risk          5.0          4.42X Average Risk          9.6  7.13X Average Risk          15.0          11.0                       NonHDL 113.57     Comment: NOTE:  Non-HDL goal should be 30 mg/dL higher than patient's LDL goal (i.e. LDL goal of < 70 mg/dL, would have non-HDL goal of < 100 mg/dL)  PSA     Status: None   Collection Time: 02/25/24  9:21 AM  Result Value Ref Range   PSA 0.74 0.10 - 4.00 ng/mL    Comment: Test performed using Access [employer]  PSA Assay, a parmagnetic partical, chemiluminecent immunoassay.  TSH     Status: None   Collection Time: 02/25/24  9:21 AM  Result Value Ref Range   TSH 1.65 0.35 - 5.50 uIU/mL  Hemoglobin A1C     Status: None   Collection Time: 02/26/24  7:27 AM  Result Value Ref Range   Hgb A1c MFr Bld 6.2 4.6 - 6.5 %    Comment: Glycemic Control Guidelines for People with Diabetes:Non Diabetic:  <6%Goal of Therapy: <7%Additional Action Suggested:  >8%     Lab Results  Component Value Date   CHOL 164 02/25/2024   CHOL 145 11/12/2023   CHOL 198 09/03/2023   Lab Results  Component Value Date   HDL 50.00 02/25/2024   HDL 47.50 11/12/2023   HDL 53.40 09/03/2023   Lab Results  Component Value Date   LDLCALC 88 02/25/2024   LDLCALC 65 11/12/2023   LDLCALC 118 (H) 09/03/2023   Lab Results  Component Value Date   TRIG 127.0 02/25/2024   TRIG 161.0 (H) 11/12/2023   TRIG 133.0 09/03/2023   Lab Results  Component Value Date   CHOLHDL 3 02/25/2024   CHOLHDL 3 11/12/2023   CHOLHDL 4 09/03/2023   Lab Results  Component Value Date   LDLDIRECT 93.0 11/28/2022    Last metabolic panel Lab Results  Component Value Date   GLUCOSE 105 (H) 02/25/2024   NA 141 02/25/2024   K 3.9 02/25/2024   CL 106 02/25/2024   CO2 29 02/25/2024   BUN 15 02/25/2024   CREATININE 0.93 02/25/2024   GFR 91.08 02/25/2024   CALCIUM 8.8 02/25/2024   PROT 6.4 02/25/2024   ALBUMIN 4.0 02/25/2024   LABGLOB 2.6 10/15/2020   AGRATIO 1.7 10/15/2020   BILITOT 0.7 02/25/2024   ALKPHOS 73 02/25/2024   AST 21 02/25/2024   ALT 26 02/25/2024    Lab Results  Component Value Date   HGBA1C 6.2 02/26/2024    Last CBC Lab Results  Component Value Date   WBC 5.2 02/25/2024   HGB 14.6 02/25/2024   HCT 43.1 02/25/2024   MCV 85.8 02/25/2024   MCH 28.7 03/12/2018   RDW 12.8 02/25/2024   PLT 192.0 02/25/2024    Lab Results  Component Value Date   TSH 1.65 02/25/2024    Lab Results  Component Value Date    PSA1 0.8 11/12/2023   PSA1 0.8 10/15/2020   PSA1 0.9 03/12/2018   PSA 0.74 02/25/2024   PSA 0.68 02/26/2023   PSA 0.53 03/28/2022    Last vitamin D Lab Results  Component Value Date   VD25OH 49 07/26/2015    Lab Results  Component Value Date   COLORU Yellow 03/12/2018   CLARITYU clear 04/07/2014   GLUCOSEUR neg 04/07/2014   BILIRUBINUR NEGATIVE 02/26/2023  KETONESU Negative 03/12/2018   SPECGRAV 1.027 03/12/2018   RBCUR neg 04/07/2014   PHUR 6.5 03/12/2018   PROTEINUR NEGATIVE 11/12/2023   UROBILINOGEN 0.2 02/26/2023   LEUKOCYTESUR NEGATIVE 02/26/2023    Lab Results  Component Value Date   MICROALBUR 1.1 11/12/2023   MICROALBUR 1.6 02/26/2023     At today's visit, we discussed treatment options, associated risk and benefits, and engage in counseling as needed.  Additionally the following were reviewed: Past medical records, past medical and surgical history, family and social background, as well as relevant laboratory results, imaging findings, and specialty notes, where applicable.  This message was generated using dictation software, and as a result, it may contain unintentional typos or errors.  Nevertheless, extensive effort was made to accurately convey at the pertinent aspects of the patient visit.    There may have been are other unrelated non-urgent complaints, but due to the busy schedule and the amount of time already spent with him, time does not permit to address these issues at today's visit. Another appointment may have or has been requested to review these additional issues.     Thomes Dinning, MD, MS

## 2024-03-03 NOTE — Patient Instructions (Signed)
 VISIT SUMMARY:  Today, you came in for your annual physical exam. We discussed your ongoing health issues, including your blood pressure, tinnitus, finger numbness, and prediabetes. We also reviewed your current medications and made some adjustments to better manage your conditions.  YOUR PLAN:  -RAYNAUD'S PHENOMENON: Raynaud's phenomenon is a condition where your fingers turn white and numb in response to cold or stress. We will run some blood tests (ANA, ESR, and CRP) to check for any underlying autoimmune diseases. You will also be referred to a dermatologist for further evaluation of your nail changes.  -TINNITUS: Tinnitus is the perception of noise or ringing in the ears. You have been experiencing this for about two weeks. Continue your follow-up with the ENT specialist and contact the [facility] tinnitus clinic for further evaluation.  -HYPERTENSION: Hypertension is high blood pressure. Your home readings are around 150 mmHg. We recommend restarting your nebivolol 2.5 mg daily to help manage your blood pressure and reduce the risk of complications.  -AORTIC ANEURYSM: An aortic aneurysm is an enlargement of the aorta that can be dangerous if not managed properly. Continue taking rosuvastatin 5 mg daily for cholesterol management and engage in cardiovascular exercise, but avoid heavy lifting over 20-30 pounds.  -PREDIABETES: Prediabetes means your blood sugar levels are higher than normal but not yet high enough to be classified as diabetes. We will recheck your blood sugar in 3 months. Continue with your dietary changes to manage your blood sugar levels.  INSTRUCTIONS:  Please follow up with the ENT specialist and contact the [facility] tinnitus clinic for your tinnitus. We will recheck your blood sugar levels in 3 months. Additionally, you will be referred to a dermatologist for further evaluation of your nail changes. Continue taking your medications as prescribed and monitor your blood pressure at  home. If you have any new symptoms or concerns, please contact our office.

## 2024-03-04 LAB — ANA W/REFLEX: Anti Nuclear Antibody (ANA): NEGATIVE

## 2024-03-06 ENCOUNTER — Encounter: Payer: Self-pay | Admitting: Family Medicine

## 2024-03-10 DIAGNOSIS — R21 Rash and other nonspecific skin eruption: Secondary | ICD-10-CM | POA: Diagnosis not present

## 2024-03-13 ENCOUNTER — Other Ambulatory Visit: Payer: Self-pay | Admitting: Physician Assistant

## 2024-03-13 DIAGNOSIS — H9313 Tinnitus, bilateral: Secondary | ICD-10-CM

## 2024-04-12 ENCOUNTER — Encounter: Payer: Self-pay | Admitting: Family Medicine

## 2024-04-12 ENCOUNTER — Other Ambulatory Visit: Payer: Self-pay | Admitting: Family Medicine

## 2024-04-12 DIAGNOSIS — L700 Acne vulgaris: Secondary | ICD-10-CM

## 2024-04-17 MED ORDER — CLINDAMYCIN PHOS-BENZOYL PEROX 1-5 % EX GEL: Freq: Two times a day (BID) | CUTANEOUS | 3 refills | Status: AC

## 2024-04-17 NOTE — Telephone Encounter (Signed)
 Please see the MyChart message reply(ies) for my assessment and plan.    This patient gave consent for this Medical Advice Message and is aware that it may result in a bill to [employer], as well as the possibility of receiving a bill for a co-payment or deductible. They are an established patient, but are not seeking medical advice exclusively about a problem treated during an in person or video visit in the last seven days. I did not recommend an in person or video visit within seven days of my reply.    I spent a total of 10 minutes cumulative time within 7 days through [employer].  [name], MD

## 2024-04-29 ENCOUNTER — Other Ambulatory Visit: Payer: Self-pay | Admitting: Nurse Practitioner

## 2024-04-29 DIAGNOSIS — E782 Mixed hyperlipidemia: Secondary | ICD-10-CM

## 2024-05-06 ENCOUNTER — Encounter: Payer: Self-pay | Admitting: Audiology

## 2024-06-09 DIAGNOSIS — M7711 Lateral epicondylitis, right elbow: Secondary | ICD-10-CM | POA: Diagnosis not present

## 2024-06-09 DIAGNOSIS — M7712 Lateral epicondylitis, left elbow: Secondary | ICD-10-CM | POA: Diagnosis not present

## 2024-06-16 DIAGNOSIS — S39012A Strain of muscle, fascia and tendon of lower back, initial encounter: Secondary | ICD-10-CM | POA: Diagnosis not present

## 2024-07-02 ENCOUNTER — Ambulatory Visit: Admitting: Family Medicine

## 2024-07-02 ENCOUNTER — Encounter: Payer: Self-pay | Admitting: Family Medicine

## 2024-07-02 DIAGNOSIS — E782 Mixed hyperlipidemia: Secondary | ICD-10-CM | POA: Diagnosis not present

## 2024-07-02 DIAGNOSIS — I7121 Aneurysm of the ascending aorta, without rupture: Secondary | ICD-10-CM

## 2024-07-02 DIAGNOSIS — I10 Essential (primary) hypertension: Secondary | ICD-10-CM

## 2024-07-02 MED ORDER — ROSUVASTATIN CALCIUM 5 MG PO TABS: 5.0000 mg | ORAL_TABLET | Freq: Every day | ORAL | 2 refills | Status: DC

## 2024-07-02 MED ORDER — NEBIVOLOL HCL 2.5 MG PO TABS: 2.5000 mg | ORAL_TABLET | Freq: Every day | ORAL | 3 refills | Status: AC

## 2024-07-02 NOTE — Progress Notes (Signed)
 Assessment & Plan   Assessment/Plan:    Problem List Items Addressed This Visit       Cardiovascular and Mediastinum   Primary hypertension   Relevant Medications   nebivolol  (BYSTOLIC ) 2.5 MG tablet   rosuvastatin  (CRESTOR ) 5 MG tablet   Other Relevant Orders   CBC with Differential/Platelet   Comprehensive metabolic panel with GFR   Hemoglobin A1c   Lipid panel   TSH   Urinalysis, Routine w reflex microscopic   Urinalysis w microscopic + reflex cultur   Aneurysm of ascending aorta without rupture (HCC)   Relevant Medications   nebivolol  (BYSTOLIC ) 2.5 MG tablet   rosuvastatin  (CRESTOR ) 5 MG tablet   Other Relevant Orders   CBC with Differential/Platelet   Comprehensive metabolic panel with GFR   Hemoglobin A1c   Lipid panel   TSH   Urinalysis, Routine w reflex microscopic   Urinalysis w microscopic + reflex cultur     Other   Hyperlipidemia   Relevant Medications   nebivolol  (BYSTOLIC ) 2.5 MG tablet   rosuvastatin  (CRESTOR ) 5 MG tablet   Other Relevant Orders   CBC with Differential/Platelet   Comprehensive metabolic panel with GFR   Hemoglobin A1c   Lipid panel   TSH   Urinalysis, Routine w reflex microscopic   Urinalysis w microscopic + reflex cultur        Assessment and Plan Assessment & Plan Thoracic Aortic Aneurysm Ascending aortic aneurysm measuring 3.9 cm with aortic root at 4.1 cm. Last CT angiogram on September 24, 2022, recommended annual follow-up imaging. Significant risk of rupture, potentially fatal, especially with hypertension. Surgery is an option if growth continues, but involves risks including open-heart surgery and complications. - Continue annual follow-up imaging. - Emphasize strict blood pressure control.  Hypertension Non-compliance with antihypertensive medication. Current blood pressure 140-150 mmHg systolic. Target is <120/70 mmHg due to aneurysm rupture risk. Resistant to medication, relying on lifestyle modifications  (exercise, beet juice, potassium, magnesium ) which have not achieved target. Emphasized critical importance of medication adherence. Beta blockers like nebivolol  lower blood pressure and reduce aneurysm rupture risk. - Restart nebivolol  2.5 mg daily. - Emphasize medication adherence. - Prescribe potassium supplement. - Recommend magnesium  citrate or gluconate; avoid magnesium  oxide.  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 Non-compliance with rosuvastatin , crucial for LDL management and plaque stabilization. Statins stabilize plaques, reducing cardiovascular event risk. - Emphasize importance of rosuvastatin  for LDL management and plaque stabilization. - Check LDL levels with blood work.  General Health Maintenance Engaging in lifestyle modifications (exercise, dietary changes, supplementation) to manage health conditions. Acknowledged efforts but emphasized necessity of medication for specific conditions. - Encourage continued exercise and dietary modifications. - Schedule fasting blood work for LDL levels.      Medications Discontinued During This Encounter  Medication Reason   nebivolol  (BYSTOLIC ) 2.5 MG tablet Reorder   rosuvastatin  (CRESTOR ) 5 MG tablet Reorder    Return in about 3 months (around 10/02/2024) for BP, fasting labs (within 1 week of today) .        Subjective:   Encounter date: 07/02/2024  Nathan Kelly is a 58 y.o. male who has Screen for colon cancer; Vitamin D  deficiency; Hyperlipidemia; Acne vulgaris; Overweight (BMI 25.0-29.9); Primary hypertension; Eczema; Paronychia of finger of right hand; Statin medication declined by patient; Chronic midline low back pain with right-sided sciatica; Situational anxiety; Aneurysm of ascending aorta without rupture (HCC); Kidney lesion, native, left; Spinal stenosis of lumbar region without neurogenic claudication; Hand dermatitis; Prediabetes; Clubbing  of fingers; and Raynaud's disease  without gangrene on their problem list..   He  has a past medical history of Eczema (06/27/2022), Hyperglycemia (03/28/2022), Hyperlipidemia, and Paronychia of finger of right hand (12/03/2022).SABRA   He presents with chief complaint of Hypertension (Want to discuss bp meds; for the last month have been taking chia seed, potassium and magnesium ) .   Discussed the use of AI scribe software for clinical note transcription with the patient, who gave verbal consent to proceed.  History of Present Illness Nathan Kelly is a 58 year old male with an ascending aortic aneurysm and primary hypertension who presents for follow-up on blood pressure management.  He has been monitoring his blood pressure at home, with recent readings of 140/90 mmHg, and aims to maintain it under 120/70 mmHg due to his aneurysm. He has discontinued nebivolol  2.5 mg and rosuvastatin  5 mg, opting for alternative methods such as THC, beet juice, and potassium and magnesium  supplements. He exercises regularly using a bicycle and performs exercises for his lower back.  His last CT angiogram on September 24, 2022, revealed a 4.1 cm aortic root and a 3.9 cm ascending aortic aneurysm, with a recommendation for annual follow-up imaging. He also has coronary artery atherosclerosis on the LAD.  He is concerned about his LDL levels and has been using chia seeds and has lost some weight. He is interested in checking his LDL levels to evaluate the effectiveness of his current regimen.  He experiences fatigue and shortness of breath at times, which prompts him to check his blood pressure. He is also concerned about stress from work and family responsibilities.     ROS  History reviewed. No pertinent surgical history.  Outpatient Medications Prior to Visit  Medication Sig Dispense Refill   Chia Oil (CHIA SEED OIL EXTRACT PO) Take by mouth.     MAGNESIUM  GLYCINATE PO Take by mouth.     cholecalciferol (VITAMIN D3) 25 MCG (1000 UNIT) tablet  Take 1,000 Units by mouth daily. (Patient not taking: Reported on 07/02/2024)     clindamycin -benzoyl peroxide (BENZACLIN) gel Apply topically 2 (two) times daily. (Patient not taking: Reported on 07/02/2024) 35 g 3   fluticasone  (FLONASE ) 50 MCG/ACT nasal spray SPRAY 2 SPRAYS INTO EACH NOSTRIL EVERY DAY (Patient not taking: Reported on 07/02/2024) 48 mL 1   hydrocortisone  2.5 % cream APPLY TOPICALLY TWICE A DAY (Patient not taking: Reported on 07/02/2024) 28 g 0   Icosapent  Ethyl 0.5 g CAPS Take 4 capsules (2 g total) by mouth 2 (two) times daily. (Patient not taking: Reported on 07/02/2024) 720 capsule 3   loratadine  (CLARITIN ) 10 MG tablet TAKE 1 TABLET BY MOUTH EVERY DAY (Patient not taking: Reported on 07/02/2024) 90 tablet 1   nebivolol  (BYSTOLIC ) 2.5 MG tablet Take 1 tablet (2.5 mg total) by mouth daily. (Patient not taking: Reported on 07/02/2024) 90 tablet 3   rosuvastatin  (CRESTOR ) 5 MG tablet TAKE 1 TABLET (5 MG TOTAL) BY MOUTH DAILY. (Patient not taking: Reported on 07/02/2024) 30 tablet 2   No facility-administered medications prior to visit.    Family History  Problem Relation Age of Onset   Diabetes Mother    Hypertension Mother    Hypertension Daughter     Social History   Socioeconomic History   Marital status: Married    Spouse name: Raenette   Number of children: 3   Years of education: Not on file   Highest education level: Associate degree: occupational, Scientist, product/process development, or vocational program  Occupational History   Occupation: Advertising account planner    Comment: Set designer  Tobacco Use   Smoking status: Former    Current packs/day: 0.00    Average packs/day: 0.5 packs/day for 30.0 years (15.0 ttl pk-yrs)    Types: Cigarettes    Start date: 12/11/1978    Quit date: 12/11/2008    Years since quitting: 15.5   Smokeless tobacco: Never  Vaping Use   Vaping status: Never Used  Substance and Sexual Activity   Alcohol use: No    Alcohol/week: 0.0 standard drinks of alcohol   Drug  use: No   Sexual activity: Yes    Partners: Female    Birth control/protection: Surgical  Other Topics Concern   Not on file  Social History Narrative   Moved to [place]  in 1989 from [place]   Lives with wife and 3 daughters      Wears seatbelt 100%   Guns in home - yes secured   Social Drivers of Health   Financial Resource Strain: Low Risk  (02/20/2024)   Overall Financial Resource Strain (CARDIA)    Difficulty of Paying Living Expenses: Not hard at all  Food Insecurity: No Food Insecurity (02/20/2024)   Hunger Vital Sign    Worried About Running Out of Food in the Last Year: Never true    Ran Out of Food in the Last Year: Never true  Transportation Needs: No Transportation Needs (02/20/2024)   PRAPARE - Administrator, Civil Service (Medical): No    Lack of Transportation (Non-Medical): No  Physical Activity: Sufficiently Active (02/20/2024)   Exercise Vital Sign    Days of Exercise per Week: 7 days    Minutes of Exercise per Session: 30 min  Stress: Stress Concern Present (02/20/2024)   [employer] of Occupational Health - Occupational Stress Questionnaire    Feeling of Stress : To some extent  Social Connections: Socially Isolated (02/20/2024)   Social Connection and Isolation Panel    Frequency of Communication with Friends and Family: Once a week    Frequency of Social Gatherings with Friends and Family: Once a week    Attends Religious Services: Never    Database administrator or Organizations: No    Attends Engineer, structural: Not on file    Marital Status: Married  Intimate Partner Violence: Unknown (09/03/2023)   Received from [facility]   HITS    Physically Hurt: Not on file    Insult or Talk Down To: Not on file    Threaten Physical Harm: Not on file    Scream or Curse: Not on file                                                                                                  Objective:  Physical Exam: BP (!) 140/90   Pulse 78    Temp 97.9 F (36.6 C)   Ht 5' 9 (1.753 m)   Wt 179 lb (81.2 kg)   SpO2 99%   BMI 26.43 kg/m   Wt Readings from Last 3 Encounters:  07/02/24  179 lb (81.2 kg)  03/03/24 178 lb 3.2 oz (80.8 kg)  02/29/24 178 lb (80.7 kg)    Physical Exam GENERAL: Alert, cooperative, well developed, no acute distress HEENT: Normocephalic, normal oropharynx, moist mucous membranes CHEST: Clear to auscultation bilaterally, No wheezes, rhonchi, or crackles CARDIOVASCULAR: Normal heart rate and rhythm, S1 and S2 normal without murmurs ABDOMEN: Soft, non-tender, non-distended, without organomegaly, Normal bowel sounds EXTREMITIES: No cyanosis or edema NEUROLOGICAL: Cranial nerves grossly intact, Moves all extremities without gross motor or sensory deficit   Physical Exam  No results found.  No results found for this or any previous visit (from the past 2160 hours).      Beverley Adine Hummer, MD, MS

## 2024-07-02 NOTE — Patient Instructions (Signed)
  VISIT SUMMARY: During your visit, we discussed your blood pressure management, aortic aneurysm, and coronary artery health. We reviewed your recent home blood pressure readings and your current regimen, including your use of alternative methods and exercise. We also discussed your concerns about LDL levels and the importance of medication adherence.  YOUR PLAN: -THORACIC AORTIC ANEURYSM: You have an ascending aortic aneurysm, which is a bulge in the wall of the aorta that can be life-threatening if it ruptures. We will continue with annual imaging to monitor its size and emphasize the importance of keeping your blood pressure under control.  -HYPERTENSION: Hypertension, or high blood pressure, increases the risk of your aneurysm rupturing. Your current blood pressure is higher than the target. We recommend restarting nebivolol  2.5 mg daily and adhering to your medication regimen. We also suggest continuing with potassium and magnesium  supplements, but avoid magnesium  oxide.  -CORONARY ARTERY ATHEROSCLEROSIS: Coronary artery atherosclerosis is the buildup of plaque in the arteries of your heart, which can lead to heart disease. It is important to manage your LDL cholesterol levels to stabilize these plaques. We recommend restarting rosuvastatin  and will check your LDL levels with a blood test.  -GENERAL HEALTH MAINTENANCE: You are doing well with your exercise and dietary changes. Continue these healthy habits. We will schedule fasting blood work to check your LDL levels.  INSTRUCTIONS: Please restart taking nebivolol  2.5 mg daily and rosuvastatin  as prescribed. Continue with your potassium and magnesium  supplements, but avoid magnesium  oxide. Schedule your fasting blood work to check your LDL levels. Continue with your exercise routine and dietary modifications. Follow up with annual imaging for your aortic aneurysm.

## 2024-07-03 ENCOUNTER — Other Ambulatory Visit (INDEPENDENT_AMBULATORY_CARE_PROVIDER_SITE_OTHER)

## 2024-07-03 ENCOUNTER — Encounter: Payer: Self-pay | Admitting: Family Medicine

## 2024-07-03 DIAGNOSIS — E782 Mixed hyperlipidemia: Secondary | ICD-10-CM

## 2024-07-03 DIAGNOSIS — I7121 Aneurysm of the ascending aorta, without rupture: Secondary | ICD-10-CM

## 2024-07-03 DIAGNOSIS — I10 Essential (primary) hypertension: Secondary | ICD-10-CM

## 2024-07-03 LAB — CBC WITH DIFFERENTIAL/PLATELET
Basophils Absolute: 0 K/uL (ref 0.0–0.1)
Basophils Relative: 0.7 % (ref 0.0–3.0)
Eosinophils Absolute: 0.1 K/uL (ref 0.0–0.7)
Eosinophils Relative: 2.2 % (ref 0.0–5.0)
HCT: 47.4 % (ref 39.0–52.0)
Hemoglobin: 15.7 g/dL (ref 13.0–17.0)
Lymphocytes Relative: 22.1 % (ref 12.0–46.0)
Lymphs Abs: 1.1 K/uL (ref 0.7–4.0)
MCHC: 33.1 g/dL (ref 30.0–36.0)
MCV: 85.1 fl (ref 78.0–100.0)
Monocytes Absolute: 0.4 K/uL (ref 0.1–1.0)
Monocytes Relative: 7.6 % (ref 3.0–12.0)
Neutro Abs: 3.4 K/uL (ref 1.4–7.7)
Neutrophils Relative %: 67.4 % (ref 43.0–77.0)
Platelets: 222 K/uL (ref 150.0–400.0)
RBC: 5.57 Mil/uL (ref 4.22–5.81)
RDW: 12.9 % (ref 11.5–15.5)
WBC: 5 K/uL (ref 4.0–10.5)

## 2024-07-03 LAB — URINALYSIS, ROUTINE W REFLEX MICROSCOPIC
Bilirubin Urine: NEGATIVE
Hgb urine dipstick: NEGATIVE
Ketones, ur: NEGATIVE
Leukocytes,Ua: NEGATIVE
Nitrite: NEGATIVE
RBC / HPF: NONE SEEN (ref 0–?)
Specific Gravity, Urine: 1.01 (ref 1.000–1.030)
Total Protein, Urine: NEGATIVE
Urine Glucose: NEGATIVE
Urobilinogen, UA: 0.2 (ref 0.0–1.0)
pH: 7 (ref 5.0–8.0)

## 2024-07-03 LAB — COMPREHENSIVE METABOLIC PANEL WITH GFR
ALT: 21 U/L (ref 0–53)
AST: 18 U/L (ref 0–37)
Albumin: 4.2 g/dL (ref 3.5–5.2)
Alkaline Phosphatase: 75 U/L (ref 39–117)
BUN: 17 mg/dL (ref 6–23)
CO2: 34 meq/L — ABNORMAL HIGH (ref 19–32)
Calcium: 9.1 mg/dL (ref 8.4–10.5)
Chloride: 101 meq/L (ref 96–112)
Creatinine, Ser: 1.04 mg/dL (ref 0.40–1.50)
GFR: 79.45 mL/min (ref >60.00)
Glucose, Bld: 103 mg/dL — ABNORMAL HIGH (ref 70–99)
Potassium: 4 meq/L (ref 3.5–5.1)
Sodium: 138 meq/L (ref 135–145)
Total Bilirubin: 0.6 mg/dL (ref 0.2–1.2)
Total Protein: 7.4 g/dL (ref 6.0–8.3)

## 2024-07-03 LAB — LIPID PANEL
Cholesterol: 173 mg/dL (ref 0–200)
HDL: 46.3 mg/dL (ref >39.00)
LDL Cholesterol: 104 mg/dL — ABNORMAL HIGH (ref 0–99)
NonHDL: 126.89
Total CHOL/HDL Ratio: 4
Triglycerides: 116 mg/dL (ref 0.0–149.0)
VLDL: 23.2 mg/dL (ref 0.0–40.0)

## 2024-07-03 LAB — HEMOGLOBIN A1C: Hgb A1c MFr Bld: 6.1 % (ref 4.6–6.5)

## 2024-07-03 LAB — TSH: TSH: 0.94 u[IU]/mL (ref 0.35–5.50)

## 2024-07-04 ENCOUNTER — Ambulatory Visit: Payer: Self-pay | Admitting: Family Medicine

## 2024-07-04 LAB — URINALYSIS W MICROSCOPIC + REFLEX CULTURE
Bacteria, UA: NONE SEEN /HPF
Bilirubin Urine: NEGATIVE
Glucose, UA: NEGATIVE
Hgb urine dipstick: NEGATIVE
Hyaline Cast: NONE SEEN /LPF
Ketones, ur: NEGATIVE
Leukocyte Esterase: NEGATIVE
Nitrites, Initial: NEGATIVE
Protein, ur: NEGATIVE
RBC / HPF: NONE SEEN /HPF (ref 0–2)
Specific Gravity, Urine: 1.016 (ref 1.001–1.035)
Squamous Epithelial / HPF: NONE SEEN /HPF (ref <=5)
WBC, UA: NONE SEEN /HPF (ref 0–5)
pH: 7 (ref 5.0–8.0)

## 2024-07-04 LAB — NO CULTURE INDICATED

## 2024-07-09 NOTE — Telephone Encounter (Signed)
 Copied from CRM [id number]. Topic: Referral - Question >> Jul 09, 2024 12:55 PM Deleta RAMAN wrote: Reason for CRM: patient is requesting kidney referral please advise. Phone call dropped 3 times was not able to assist

## 2024-08-19 ENCOUNTER — Other Ambulatory Visit: Payer: Self-pay | Admitting: Surgery

## 2024-08-19 DIAGNOSIS — I7121 Aneurysm of the ascending aorta, without rupture: Secondary | ICD-10-CM

## 2024-08-31 ENCOUNTER — Other Ambulatory Visit: Payer: Self-pay | Admitting: Family Medicine

## 2024-08-31 DIAGNOSIS — E782 Mixed hyperlipidemia: Secondary | ICD-10-CM

## 2024-09-30 ENCOUNTER — Ambulatory Visit: Admitting: Family Medicine

## 2024-09-30 ENCOUNTER — Encounter: Payer: Self-pay | Admitting: Family Medicine

## 2024-09-30 ENCOUNTER — Ambulatory Visit: Payer: Self-pay | Admitting: Family Medicine

## 2024-09-30 VITALS — BP 130/78 | HR 57 | Temp 96.9°F | Ht 69.0 in | Wt 177.2 lb

## 2024-09-30 DIAGNOSIS — E782 Mixed hyperlipidemia: Secondary | ICD-10-CM | POA: Diagnosis not present

## 2024-09-30 DIAGNOSIS — L700 Acne vulgaris: Secondary | ICD-10-CM

## 2024-09-30 DIAGNOSIS — Z23 Encounter for immunization: Secondary | ICD-10-CM | POA: Diagnosis not present

## 2024-09-30 DIAGNOSIS — I2510 Atherosclerotic heart disease of native coronary artery without angina pectoris: Secondary | ICD-10-CM

## 2024-09-30 DIAGNOSIS — I7121 Aneurysm of the ascending aorta, without rupture: Secondary | ICD-10-CM

## 2024-09-30 DIAGNOSIS — I2584 Coronary atherosclerosis due to calcified coronary lesion: Secondary | ICD-10-CM

## 2024-09-30 DIAGNOSIS — M48061 Spinal stenosis, lumbar region without neurogenic claudication: Secondary | ICD-10-CM | POA: Diagnosis not present

## 2024-09-30 DIAGNOSIS — I10 Essential (primary) hypertension: Secondary | ICD-10-CM

## 2024-09-30 LAB — COMPREHENSIVE METABOLIC PANEL WITH GFR
ALT: 25 U/L (ref 0–53)
AST: 22 U/L (ref 0–37)
Albumin: 4.4 g/dL (ref 3.5–5.2)
Alkaline Phosphatase: 72 U/L (ref 39–117)
BUN: 15 mg/dL (ref 6–23)
CO2: 30 meq/L (ref 19–32)
Calcium: 9.1 mg/dL (ref 8.4–10.5)
Chloride: 103 meq/L (ref 96–112)
Creatinine, Ser: 0.98 mg/dL (ref 0.40–1.50)
GFR: 85.17 mL/min (ref >60.00)
Glucose, Bld: 104 mg/dL — ABNORMAL HIGH (ref 70–99)
Potassium: 4.2 meq/L (ref 3.5–5.1)
Sodium: 139 meq/L (ref 135–145)
Total Bilirubin: 0.6 mg/dL (ref 0.2–1.2)
Total Protein: 7.4 g/dL (ref 6.0–8.3)

## 2024-09-30 LAB — LIPID PANEL
Cholesterol: 165 mg/dL (ref 0–200)
HDL: 46.6 mg/dL (ref >39.00)
LDL Cholesterol: 87 mg/dL (ref 0–99)
NonHDL: 118.68
Total CHOL/HDL Ratio: 4
Triglycerides: 160 mg/dL — ABNORMAL HIGH (ref 0.0–149.0)
VLDL: 32 mg/dL (ref 0.0–40.0)

## 2024-09-30 MED ORDER — ICOSAPENT ETHYL 0.5 G PO CAPS: 4.0000 | ORAL_CAPSULE | Freq: Two times a day (BID) | ORAL | 3 refills | Status: AC

## 2024-09-30 MED ORDER — ROSUVASTATIN CALCIUM 5 MG PO TABS
5.0000 mg | ORAL_TABLET | Freq: Every day | ORAL | 3 refills | Status: AC
Start: 2024-09-30 — End: 2025-09-25

## 2024-09-30 NOTE — Progress Notes (Signed)
 Assessment & Plan   Assessment/Plan:    Problem List Items Addressed This Visit     Hyperlipidemia   Relevant Medications   Icosapent  Ethyl 0.5 g CAPS   rosuvastatin  (CRESTOR ) 5 MG tablet   Other Relevant Orders   Comp Met (CMET)   Lipid panel   Acne vulgaris   Primary hypertension   Relevant Medications   Icosapent  Ethyl 0.5 g CAPS   rosuvastatin  (CRESTOR ) 5 MG tablet   Aneurysm of ascending aorta without rupture   Relevant Medications   Icosapent  Ethyl 0.5 g CAPS   rosuvastatin  (CRESTOR ) 5 MG tablet   Spinal stenosis of lumbar region without neurogenic claudication - Primary   Relevant Orders   Ambulatory referral to Neurosurgery   Coronary artery disease due to calcified coronary lesion   Relevant Medications   Icosapent  Ethyl 0.5 g CAPS   rosuvastatin  (CRESTOR ) 5 MG tablet         Assessment and Plan Assessment & Plan Ascending aortic aneurysm Monitored by vascular surgery. Blood pressure control is crucial to prevent expansion. - Maintain blood pressure under 120/70 mmHg if possible - Next CT scan is pending  Hypertension Blood pressure goal is under 130/80 mmHg, with an optimal target of under 120/70 mmHg to prevent expansion of the aneurysm. Current home readings are improving, with afternoon readings around 140 mmHg or lower. Pain may contribute to elevated morning readings. - Continue nebivolol  2.5 mg daily - Encourage lifestyle modifications to manage blood pressure - Recheck blood pressure today  Coronary artery disease (LAD) Atherosclerosis in the LAD. LDL goal is under 100 mg/dL, preferably under 70 mg/dL. - Continue rosuvastatin  5 mg daily - Check fasting lipid panel  Hyperlipidemia LDL was 104 mg/dL six months ago, above the goal. Rosuvastatin  was restarted to manage hyperlipidemia. - Continue rosuvastatin  5 mg daily - Check fasting lipid panel  Prediabetes Managed with lifestyle modifications. - Recommend lifestyle modifications for  healthy diet and exercise - Check blood sugar levels  Lumbar spinal stenosis with sciatica Chronic back pain with sciatica, worsened by prolonged activity. Previous physical therapy was ineffective. Considering neurosurgery referral for further evaluation and potential interventions. Discussed potential for targeted injections or surgery if needed. He prefers to continue with physical therapy and lifestyle modifications before considering medication or surgery. - Refer to neurosurgery for further evaluation - Continue OTC analgesics and lifestyle exercises for pain management  Acne vulgaris Primarily on the face, managed with topical clindamycin -benzoyl peroxide gel. - Continue clindamycin -benzoyl peroxide gel twice daily  General Health Maintenance Discussed vaccinations including pneumonia and tetanus. He expressed interest in receiving these vaccinations. - Administer tetanus vaccine today - Discuss and potentially administer pneumonia vaccine      Medications Discontinued During This Encounter  Medication Reason   hydrocortisone  2.5 % cream    Icosapent  Ethyl 0.5 g CAPS    loratadine  (CLARITIN ) 10 MG tablet    fluticasone  (FLONASE ) 50 MCG/ACT nasal spray    Chia Oil (CHIA SEED OIL EXTRACT PO)    rosuvastatin  (CRESTOR ) 5 MG tablet      Return in about 6 months (around 03/31/2025) for BP, HLD.        Subjective:   Encounter date: 09/30/2024  Myreon Wimer is a 58 y.o. male who has Screen for colon cancer; Vitamin D  deficiency; Hyperlipidemia; Acne vulgaris; Overweight (BMI 25.0-29.9); Primary hypertension; Eczema; Paronychia of finger of right hand; Statin medication declined by patient; Chronic midline low back pain with right-sided sciatica; Situational anxiety; Aneurysm of ascending aorta  without rupture; Kidney lesion, native, left; Spinal stenosis of lumbar region without neurogenic claudication; Hand dermatitis; Prediabetes; Clubbing of fingers; Raynaud's disease  without gangrene; and Coronary artery disease due to calcified coronary lesion on their problem list..   He  has a past medical history of Eczema (06/27/2022), Hyperglycemia (03/28/2022), Hyperlipidemia, Hypertension, and Paronychia of finger of right hand (12/03/2022).SABRA   He presents with chief complaint of Hypertension (3 month follow up for HTN. Pt has some questions regarding taking Chia seeds and would like a refill on fish oil. Discuss TDAP and Pneumonia vaccines today.) .   Discussed the use of AI scribe software for clinical note transcription with the patient, who gave verbal consent to proceed.  History of Present Illness 07/02/2024  Meyer Arora is a 58 year old male with an ascending aortic aneurysm and primary hypertension who presents for follow-up on blood pressure management.  He has been monitoring his blood pressure at home, with recent readings of 140/90 mmHg, and aims to maintain it under 120/70 mmHg due to his aneurysm. He has discontinued nebivolol  2.5 mg and rosuvastatin  5 mg, opting for alternative methods such as THC, beet juice, and potassium and magnesium  supplements. He exercises regularly using a bicycle and performs exercises for his lower back.  His last CT angiogram on September 24, 2022, revealed a 4.1 cm aortic root and a 3.9 cm ascending aortic aneurysm, with a recommendation for annual follow-up imaging. He also has coronary artery atherosclerosis on the LAD.  He is concerned about his LDL levels and has been using chia seeds and has lost some weight. He is interested in checking his LDL levels to evaluate the effectiveness of his current regimen.  He experiences fatigue and shortness of breath at times, which prompts him to check his blood pressure. He is also concerned about stress from work and family responsibilities.   Tasheem Mccleave is a 57 year old male with hypertension and coronary artery disease who presents for follow-up on blood pressure  management.  Hypertension and coronary artery disease - Diagnosed with hypertension and coronary arterial atherosclerosis involving the LAD - Currently taking nebivolol  2.5 mg daily for blood pressure management - Home blood pressure readings have improved, now around 140 mmHg or lower in the afternoon - No chest pain, shortness of breath, or leg swelling - History of ascending aortic aneurysm  Hyperlipidemia - LDL measured at 104 mg/dL six months ago, above goal of <100 mg/dL, preferably <29 mg/dL - Resistant to pharmacologic therapy for hyperlipidemia - Adheres to a healthy lifestyle - Interested in rechecking cholesterol, blood sugar, and liver function  Spinal stenosis and sciatica - Chronic spinal stenosis and sciatica limiting ability to exercise - Significant discomfort, especially in the morning, with improvement as the day progresses - Sciatica pain radiates down the leg - Has tried chiropractic care and physical therapy without relief - Reduced treadmill running time to manage symptoms  Acne vulgaris - Uses clindamycin -benzoyl peroxide gel twice daily for facial acne  Supplement and medication use - Stopped taking GSEs twice daily due to concerns about side effects; now takes once daily in the morning - Takes icosapent  ethyl 0.5 g capsules, four capsules twice a day  Preventive health and immunizations - Interested in receiving pneumonia and tetanus vaccinations - Monitors blood pressure at home with noted improvement  BP Readings from Last 3 Encounters:  09/30/24 130/78  07/02/24 (!) 140/90  03/03/24 (!) 142/84      ROS  History reviewed. No pertinent  surgical history.  Outpatient Medications Prior to Visit  Medication Sig Dispense Refill   clindamycin -benzoyl peroxide (BENZACLIN) gel Apply topically 2 (two) times daily. 35 g 3   MAGNESIUM  GLYCINATE PO Take by mouth.     nebivolol  (BYSTOLIC ) 2.5 MG tablet Take 1 tablet (2.5 mg total) by mouth daily. 90  tablet 3   rosuvastatin  (CRESTOR ) 5 MG tablet Take 1 tablet (5 mg total) by mouth daily. 30 tablet 2   cholecalciferol (VITAMIN D3) 25 MCG (1000 UNIT) tablet Take 1,000 Units by mouth daily. (Patient not taking: Reported on 09/30/2024)     Chia Oil (CHIA SEED OIL EXTRACT PO) Take by mouth.     fluticasone  (FLONASE ) 50 MCG/ACT nasal spray SPRAY 2 SPRAYS INTO EACH NOSTRIL EVERY DAY (Patient not taking: Reported on 07/02/2024) 48 mL 1   hydrocortisone  2.5 % cream APPLY TOPICALLY TWICE A DAY (Patient not taking: Reported on 07/02/2024) 28 g 0   Icosapent  Ethyl 0.5 g CAPS Take 4 capsules (2 g total) by mouth 2 (two) times daily. (Patient not taking: Reported on 09/30/2024) 720 capsule 3   loratadine  (CLARITIN ) 10 MG tablet TAKE 1 TABLET BY MOUTH EVERY DAY (Patient not taking: Reported on 07/02/2024) 90 tablet 1   No facility-administered medications prior to visit.    Family History  Problem Relation Age of Onset   Diabetes Mother    Hypertension Mother    Hypertension Daughter     Social History   Socioeconomic History   Marital status: Married    Spouse name: Raenette   Number of children: 3   Years of education: Not on file   Highest education level: Associate degree: occupational, Scientist, product/process development, or vocational program  Occupational History   Occupation: Advertising account planner    Comment: [employer]  Tobacco Use   Smoking status: Former    Current packs/day: 0.00    Average packs/day: 0.5 packs/day for 30.0 years (15.0 ttl pk-yrs)    Types: Cigarettes    Start date: 12/11/1978    Quit date: 12/11/2008    Years since quitting: 15.8   Smokeless tobacco: Never  Vaping Use   Vaping status: Never Used  Substance and Sexual Activity   Alcohol use: No    Alcohol/week: 0.0 standard drinks of alcohol   Drug use: No   Sexual activity: Yes    Partners: Female    Birth control/protection: Surgical  Other Topics Concern   Not on file  Social History Narrative   Moved to [place]  in 1989 from [place]    Lives with wife and 3 daughters      Wears seatbelt 100%   Guns in home - yes secured   Social Drivers of Health   Financial Resource Strain: Low Risk  (09/28/2024)   Overall Financial Resource Strain (CARDIA)    Difficulty of Paying Living Expenses: Not hard at all  Food Insecurity: No Food Insecurity (09/28/2024)   Hunger Vital Sign    Worried About Running Out of Food in the Last Year: Never true    Ran Out of Food in the Last Year: Never true  Transportation Needs: No Transportation Needs (09/28/2024)   PRAPARE - Administrator, Civil Service (Medical): No    Lack of Transportation (Non-Medical): No  Physical Activity: Insufficiently Active (09/28/2024)   Exercise Vital Sign    Days of Exercise per Week: 7 days    Minutes of Exercise per Session: 20 min  Stress: No Stress Concern Present (09/28/2024)   [place]  [employer] - Occupational Stress Questionnaire    Feeling of Stress: Not at all  Social Connections: Moderately Integrated (09/28/2024)   Social Connection and Isolation Panel    Frequency of Communication with Friends and Family: More than three times a week    Frequency of Social Gatherings with Friends and Family: Once a week    Attends Religious Services: 1 to 4 times per year    Active Member of [employer] or Organizations: No    Attends Engineer, structural: Not on file    Marital Status: Married  Intimate Partner Violence: Unknown (09/03/2023)   Received from [facility]   HITS    Physically Hurt: Not on file    Insult or Talk Down To: Not on file    Threaten Physical Harm: Not on file    Scream or Curse: Not on file                                                                                                  Objective:  Physical Exam: BP 130/78 (BP Location: Left Arm, Patient Position: Sitting, Cuff Size: Normal)   Pulse (!) 57   Temp (!) 96.9 F (36.1 C) (Temporal)   Ht 5' 9 (1.753 m)   Wt 177 lb 3.2  oz (80.4 kg)   SpO2 100%   BMI 26.17 kg/m   Wt Readings from Last 3 Encounters:  09/30/24 177 lb 3.2 oz (80.4 kg)  07/02/24 179 lb (81.2 kg)  03/03/24 178 lb 3.2 oz (80.8 kg)    Physical Exam GENERAL: Alert, cooperative, well developed, no acute distress HEENT: Normocephalic, normal oropharynx, moist mucous membranes CHEST: Clear to auscultation bilaterally, No wheezes, rhonchi, or crackles CARDIOVASCULAR: Normal heart rate and rhythm, S1 and S2 normal without murmurs ABDOMEN: Soft, non-tender, non-distended, without organomegaly, Normal bowel sounds EXTREMITIES: No cyanosis or edema NEUROLOGICAL: Cranial nerves grossly intact, Moves all extremities without gross motor or sensory deficit   VITALS: P- 57 GENERAL: Alert, cooperative, well developed, no acute distress HEENT: Normocephalic, normal oropharynx, moist mucous membranes CHEST: Clear to auscultation bilaterally, no wheezes, rhonchi, or crackles CARDIOVASCULAR: Normal heart rate and rhythm, S1 and S2 normal without murmurs ABDOMEN: Soft, non-tender, non-distended, without organomegaly, normal bowel sounds EXTREMITIES: No cyanosis, edema, or leg swelling NEUROLOGICAL: Cranial nerves grossly intact, moves all extremities without gross motor or sensory deficit   Physical Exam  No results found.  Recent Results (from the past 2160 hours)  TSH     Status: None   Collection Time: 07/03/24  9:39 AM  Result Value Ref Range   TSH 0.94 0.35 - 5.50 uIU/mL  CBC with Differential/Platelet     Status: None   Collection Time: 07/03/24  9:39 AM  Result Value Ref Range   WBC 5.0 4.0 - 10.5 K/uL   RBC 5.57 4.22 - 5.81 Mil/uL   Hemoglobin 15.7 13.0 - 17.0 g/dL   HCT 52.5 60.9 - 47.9 %   MCV 85.1 78.0 - 100.0 fl   MCHC 33.1 30.0 - 36.0 g/dL   RDW 87.0 88.4 - 84.4 %  Platelets 222.0 150.0 - 400.0 K/uL   Neutrophils Relative % 67.4 43.0 - 77.0 %   Lymphocytes Relative 22.1 12.0 - 46.0 %   Monocytes Relative 7.6 3.0 - 12.0 %    Eosinophils Relative 2.2 0.0 - 5.0 %   Basophils Relative 0.7 0.0 - 3.0 %   Neutro Abs 3.4 1.4 - 7.7 K/uL   Lymphs Abs 1.1 0.7 - 4.0 K/uL   Monocytes Absolute 0.4 0.1 - 1.0 K/uL   Eosinophils Absolute 0.1 0.0 - 0.7 K/uL   Basophils Absolute 0.0 0.0 - 0.1 K/uL  Comprehensive metabolic panel with GFR     Status: Abnormal   Collection Time: 07/03/24  9:39 AM  Result Value Ref Range   Sodium 138 135 - 145 mEq/L   Potassium 4.0 3.5 - 5.1 mEq/L   Chloride 101 96 - 112 mEq/L   CO2 34 (H) 19 - 32 mEq/L   Glucose, Bld 103 (H) 70 - 99 mg/dL   BUN 17 6 - 23 mg/dL   Creatinine, Ser 8.95 0.40 - 1.50 mg/dL   Total Bilirubin 0.6 0.2 - 1.2 mg/dL   Alkaline Phosphatase 75 39 - 117 U/L   AST 18 0 - 37 U/L   ALT 21 0 - 53 U/L   Total Protein 7.4 6.0 - 8.3 g/dL   Albumin 4.2 3.5 - 5.2 g/dL   GFR 20.54 >39.99 mL/min    Comment: Calculated using the CKD-EPI Creatinine Equation (2021)   Calcium  9.1 8.4 - 10.5 mg/dL  Hemoglobin J8r     Status: None   Collection Time: 07/03/24  9:39 AM  Result Value Ref Range   Hgb A1c MFr Bld 6.1 4.6 - 6.5 %    Comment: Glycemic Control Guidelines for People with Diabetes:Non Diabetic:  <6%Goal of Therapy: <7%Additional Action Suggested:  >8%   Lipid panel     Status: Abnormal   Collection Time: 07/03/24  9:39 AM  Result Value Ref Range   Cholesterol 173 0 - 200 mg/dL    Comment: ATP III Classification       Desirable:  < 200 mg/dL               Borderline High:  200 - 239 mg/dL          High:  > = 759 mg/dL   Triglycerides 883.9 0.0 - 149.0 mg/dL    Comment: Normal:  <849 mg/dLBorderline High:  150 - 199 mg/dL   HDL 53.69 >60.99 mg/dL   VLDL 76.7 0.0 - 59.9 mg/dL   LDL Cholesterol 895 (H) 0 - 99 mg/dL   Total CHOL/HDL Ratio 4     Comment:                Men          Women1/2 Average Risk     3.4          3.3Average Risk          5.0          4.42X Average Risk          9.6          7.13X Average Risk          15.0          11.0                       NonHDL 126.89      Comment: NOTE:  Non-HDL goal should be 30  mg/dL higher than patient's LDL goal (i.e. LDL goal of < 70 mg/dL, would have non-HDL goal of < 100 mg/dL)  Urinalysis w microscopic + reflex cultur     Status: None   Collection Time: 07/03/24  9:39 AM   Specimen: Blood  Result Value Ref Range   Color, Urine YELLOW YELLOW   APPearance CLEAR CLEAR   Specific Gravity, Urine 1.016 1.001 - 1.035   pH 7.0 5.0 - 8.0   Glucose, UA NEGATIVE NEGATIVE   Bilirubin Urine NEGATIVE NEGATIVE   Ketones, ur NEGATIVE NEGATIVE   Hgb urine dipstick NEGATIVE NEGATIVE   Protein, ur NEGATIVE NEGATIVE   Nitrites, Initial NEGATIVE NEGATIVE   Leukocyte Esterase NEGATIVE NEGATIVE   WBC, UA NONE SEEN 0 - 5 /HPF   RBC / HPF NONE SEEN 0 - 2 /HPF   Squamous Epithelial / HPF NONE SEEN < OR = 5 /HPF   Bacteria, UA NONE SEEN NONE SEEN /HPF   Hyaline Cast NONE SEEN NONE SEEN /LPF  Urinalysis, Routine w reflex microscopic     Status: Abnormal   Collection Time: 07/03/24  9:39 AM  Result Value Ref Range   Color, Urine YELLOW Yellow;Lt. Yellow;Straw;Dark Yellow;Amber;Green;Red;Brown   APPearance CLEAR Clear;Turbid;Slightly Cloudy;Cloudy   Specific Gravity, Urine 1.010 1.000 - 1.030   pH 7.0 5.0 - 8.0   Total Protein, Urine NEGATIVE Negative   Urine Glucose NEGATIVE Negative   Ketones, ur NEGATIVE Negative   Bilirubin Urine NEGATIVE Negative   Hgb urine dipstick NEGATIVE Negative   Urobilinogen, UA 0.2 0.0 - 1.0   Leukocytes,Ua NEGATIVE Negative   Nitrite NEGATIVE Negative   WBC, UA 0-2/hpf 0-2/hpf   RBC / HPF none seen 0-2/hpf   Mucus, UA Presence of (A) None   Squamous Epithelial / HPF Rare(0-4/hpf) Rare(0-4/hpf)   Hyaline Casts, UA Presence of (A) None  REFLEXIVE URINE CULTURE     Status: None   Collection Time: 07/03/24  9:39 AM  Result Value Ref Range   Reflexve Urine Culture      Comment: NO CULTURE INDICATED        Beverley Adine Hummer, MD, MS

## 2024-10-08 ENCOUNTER — Ambulatory Visit (HOSPITAL_COMMUNITY)
Admission: RE | Admit: 2024-10-08 | Discharge: 2024-10-08 | Disposition: A | Discharge status: Home / Self Care | Source: Ambulatory Visit | Attending: Cardiology | Admitting: Cardiology

## 2024-10-08 DIAGNOSIS — I7121 Aneurysm of the ascending aorta, without rupture: Secondary | ICD-10-CM | POA: Insufficient documentation

## 2024-10-08 DIAGNOSIS — Z0389 Encounter for observation for other suspected diseases and conditions ruled out: Secondary | ICD-10-CM | POA: Diagnosis not present

## 2024-10-08 MED ORDER — IOHEXOL 350 MG/ML SOLN
100.0000 mL | Freq: Once | INTRAVENOUS | Status: AC | PRN
  Administered 2024-10-08: 100 mL via INTRAVENOUS

## 2024-10-15 ENCOUNTER — Ambulatory Visit

## 2024-10-15 VITALS — BP 142/82 | HR 57 | Resp 20 | Ht 69.0 in | Wt 180.0 lb

## 2024-10-15 DIAGNOSIS — R911 Solitary pulmonary nodule: Secondary | ICD-10-CM

## 2024-10-15 DIAGNOSIS — I7121 Aneurysm of the ascending aorta, without rupture: Secondary | ICD-10-CM | POA: Diagnosis not present

## 2024-10-15 NOTE — Progress Notes (Signed)
 [address] [place] [address]             [phone]            Jerimie Mancuso [id number] 1966/04/20   History of Present Illness:  Nathan Kelly is a 58 year old man with medical history of hypertension, CAD, chronic midline low back pain, and hyperlipidemia who presents for continued follow up of ascending thoracic aortic aneurysm.  Aneurysm has measured stable in size and on recent CTA of chest measured 4.1 cm. Also noted on CT was new pulmonary nodule measuring 6 x 4 mm and located in the right lower lobe.  He presents to the clinic and reports that he is doing well.  His blood pressure is elevated at today's visit which he states is due to stress and walking.  He states that normal office visit readings of BP are 130s/80s.  He does not check his BP at home regularly.  He is active and exercises by walking.  He denies heavy lifting.  Denies chest pain, shortness of breath and lower leg edema.    Current Outpatient Medications on File Prior to Visit  Medication Sig Dispense Refill   cholecalciferol (VITAMIN D3) 25 MCG (1000 UNIT) tablet Take 1,000 Units by mouth daily.     clindamycin -benzoyl peroxide (BENZACLIN) gel Apply topically 2 (two) times daily. 35 g 3   Icosapent  Ethyl 0.5 g CAPS Take 4 capsules (2 g total) by mouth 2 (two) times daily. 720 capsule 3   MAGNESIUM  GLYCINATE PO Take by mouth.     nebivolol  (BYSTOLIC ) 2.5 MG tablet Take 1 tablet (2.5 mg total) by mouth daily. 90 tablet 3   rosuvastatin  (CRESTOR ) 5 MG tablet Take 1 tablet (5 mg total) by mouth daily. 90 tablet 3   No current facility-administered medications on file prior to visit.     ROS: Review of Systems  Constitutional: Negative.  Negative for malaise/fatigue.  Respiratory:  Negative for cough and shortness of breath.   Cardiovascular:  Negative for chest pain and leg swelling.     BP (!) 142/82   Pulse (!) 57   Resp 20   Ht 5' 9 (1.753 m)   Wt 180 lb (81.6 kg)    SpO2 99% Comment: RA  BMI 26.58 kg/m   Physical Exam Constitutional:      Appearance: Normal appearance.  HENT:     Head: Normocephalic and atraumatic.  Cardiovascular:     Rate and Rhythm: Normal rate and regular rhythm.     Heart sounds: Normal heart sounds, S1 normal and S2 normal.  Pulmonary:     Effort: Pulmonary effort is normal.     Breath sounds: Normal breath sounds.  Skin:    General: Skin is warm and dry.  Neurological:     General: No focal deficit present.     Mental Status: He is alert and oriented to person, place, and time.      Imaging: EXAM: CTA CHEST AORTA 10/08/2024 10:43:33 AM   TECHNIQUE: CTA of the chest was performed without and with the administration of 100 mL of iohexol (OMNIPAQUE) 350 MG/ML injection. Multiplanar reformatted images are provided for review. MIP images are provided for review. Automated exposure control, iterative reconstruction, and/or weight based adjustment of the mA/kV was utilized to reduce the radiation dose to as low as reasonably achievable.   COMPARISON: 09/25/2023   CLINICAL HISTORY: Dx: Aneurysm of ascending  aorta without rupture I71.21 (ICD-10-CM)   FINDINGS:   AORTA: Grossly stable 4.1 cm ascending thoracic aorta. No thoracic aortic dissection. Great vessels are widely patent.   MEDIASTINUM: No mediastinal lymphadenopathy. The heart and pericardium demonstrate no acute abnormality.   LYMPH NODES: No mediastinal, hilar or axillary lymphadenopathy.   LUNGS AND PLEURA: New 6 x 4 mm nodule is noted in the right lower lobe best seen on image number 83 of series 302. No focal consolidation or pulmonary edema. No pleural effusion or pneumothorax.   UPPER ABDOMEN: Limited images of the upper abdomen are unremarkable.   SOFT TISSUES AND BONES: No acute bone or soft tissue abnormality.   IMPRESSION: 1. Stable 4.1 cm ascending thoracic aorta without dissection. 2. New 6 x 4 mm solid right lower lobe  pulmonary nodule; per Fleischner Society Guidelines for a single 68 mm solid nodule with unknown risk, recommend non-contrast chest CT at 612 months; if high risk for malignancy, repeat non-contrast chest CT at 1824 months, and if low risk, consider non-contrast chest CT at 1824 months.   Electronically signed by: Lynwood Seip MD 10/08/2024 12:08 PM EDT RP Workstation: [id number]     A/P:  Aneurysm of ascending aorta without rupture -4.1 cm ascending thoracic aortic aneurysm on CTA of chest. We discussed the natural history and and risk factors for growth of ascending aortic aneurysms. Discussed recommendations to minimize the risk of further expansion or dissection including careful blood pressure control, avoidance of contact sports and heavy lifting, attention to lipid management.  We covered the importance of continued smoking cessation.  The patient does not yet meet surgical criteria of >5.5cm. The patient is aware of signs and symptoms of aortic dissection and when to present to the emergency department  -Blood pressure is elevated at today's visit.  Discussed that he should start checking home blood pressure readings and report elevated readings over 140s/90s to his PCP for possible medication management  Pulmonary nodule -New 6 mm x 4 mm solid right lower lobe pulmonary nodule -He is former smoker of cigarettes with 15 pack years.   -Follow up in 6 months with CT of chest without contrast for continue surveillance of aneurysm and pulmonary nodule   Risk Modification:  Statin:  rosuvastatin   Smoking cessation instruction/counseling given:  commended patient for quitting and reviewed strategies for preventing relapses  Patient was counseled on importance of Blood Pressure Control  They are instructed to contact their Primary Care Physician if they start to have blood pressure readings over 130s/90s. Do not ever stop blood pressure medications on your own, unless instructed by  healthcare professional.  Please avoid use of Fluoroquinolones as this can potentially increase your risk of Aortic Rupture and/or Dissection  Patient educated on signs and symptoms of Aortic Dissection, handout also provided in AVS  Manuelita CHRISTELLA Rough, PA-C 10/15/24

## 2024-10-15 NOTE — Patient Instructions (Signed)

## 2024-12-22 ENCOUNTER — Ambulatory Visit: Payer: Self-pay | Admitting: Family Medicine

## 2024-12-22 VITALS — BP 122/80 | HR 58 | Temp 97.7°F | Ht 69.0 in | Wt 178.4 lb

## 2024-12-22 DIAGNOSIS — L245 Irritant contact dermatitis due to other chemical products: Secondary | ICD-10-CM | POA: Insufficient documentation

## 2024-12-22 MED ORDER — TRIAMCINOLONE ACETONIDE 0.5 % EX CREA: 1.0000 | TOPICAL_CREAM | Freq: Two times a day (BID) | CUTANEOUS | 0 refills | Status: AC

## 2024-12-22 NOTE — Progress Notes (Signed)
 " Assessment & Plan   Assessment/Plan:    Assessment & Plan Contact dermatitis  Redness and mild swelling on the tips of the right fingers, particularly around the nails, present for over a week. No significant pain, but mild tenderness upon palpation. Likely due to contact irritation, possibly from handling cat litter without gloves. Differential includes inflammation rather than infection, as there is no drainage or significant pain. Previous similar episode possibly related to expired hand soap. - Prescribed triamcinolone  0.5% cream to be applied twice daily for up to two weeks. - Advised wearing gloves when handling cat litter to prevent further irritation. - Instructed to report if symptoms do not improve after two weeks. - F/U as needed       There are no discontinued medications.          Subjective:   Encounter date: 12/22/2024  Nathan Kelly is a 59 y.o. male who has Screen for colon cancer; Vitamin D  deficiency; Hyperlipidemia; Acne vulgaris; Overweight (BMI 25.0-29.9); Primary hypertension; Eczema; Paronychia of finger of right hand; Statin medication declined by patient; Chronic midline low back pain with right-sided sciatica; Situational anxiety; Aneurysm of ascending aorta without rupture; Kidney lesion, native, left; Spinal stenosis of lumbar region without neurogenic claudication; Hand dermatitis; Prediabetes; Clubbing of fingers; Raynaud's disease without gangrene; and Coronary artery disease due to calcified coronary lesion on their problem list..   He  has a past medical history of Eczema (06/27/2022), Hyperglycemia (03/28/2022), Hyperlipidemia, Hypertension, and Paronychia of finger of right hand (12/03/2022).SABRA   He presents with chief complaint of Acute Visit (Pt presents tiday for redness on fingers, states its been a week to over 2 weeks. At first he thought it was the soap but its still red ) .   Discussed the use of AI scribe software for clinical note  transcription with the patient, who gave verbal consent to proceed.  History of Present Illness Nathan Kelly is a 59 year old male who presents with redness on his hands.  Fingertip erythema and swelling - Redness present on the tips of multiple fingers for the past two weeks - Redness described as 'under the skin' and hard when palpated - No associated pain or pruritus - Mild swelling noted over the past week - No improvement in redness despite switching soaps and daily use of body lotion - No use of specific creams or ointments for the redness - Similar episode in the past attributed to expired hand soap  Exposure history - Frequent hand washing after emptying cat litter box daily without gloves - Initial use of liquid soap prior to switching to [employer]     ROS  No past surgical history on file.  Outpatient Medications Prior to Visit  Medication Sig Dispense Refill   cholecalciferol (VITAMIN D3) 25 MCG (1000 UNIT) tablet Take 1,000 Units by mouth daily.     clindamycin -benzoyl peroxide (BENZACLIN) gel Apply topically 2 (two) times daily. 35 g 3   Icosapent  Ethyl 0.5 g CAPS Take 4 capsules (2 g total) by mouth 2 (two) times daily. 720 capsule 3   MAGNESIUM  GLYCINATE PO Take by mouth.     nebivolol  (BYSTOLIC ) 2.5 MG tablet Take 1 tablet (2.5 mg total) by mouth daily. 90 tablet 3   rosuvastatin  (CRESTOR ) 5 MG tablet Take 1 tablet (5 mg total) by mouth daily. 90 tablet 3   No facility-administered medications prior to visit.    Family History  Problem Relation Age of Onset   Diabetes Mother  Hypertension Mother    Hypertension Daughter     Social History   Socioeconomic History   Marital status: Married    Spouse name: Raenette   Number of children: 3   Years of education: Not on file   Highest education level: Associate degree: occupational, scientist, product/process development, or vocational program  Occupational History   Occupation: advertising account planner    Comment: [employer]   Tobacco Use   Smoking status: Former    Current packs/day: 0.00    Average packs/day: 0.5 packs/day for 30.0 years (15.0 ttl pk-yrs)    Types: Cigarettes    Start date: 12/11/1978    Quit date: 12/11/2008    Years since quitting: 16.0   Smokeless tobacco: Never  Vaping Use   Vaping status: Never Used  Substance and Sexual Activity   Alcohol use: No    Alcohol/week: 0.0 standard drinks of alcohol   Drug use: No   Sexual activity: Yes    Partners: Female    Birth control/protection: Surgical  Other Topics Concern   Not on file  Social History Narrative   Moved to [place]  in 1989 from [place]   Lives with wife and 3 daughters      Wears seatbelt 100%   Guns in home - yes secured   Social Drivers of Health   Tobacco Use: Medium Risk (10/15/2024)   Patient History    Smoking Tobacco Use: Former    Smokeless Tobacco Use: Never    Passive Exposure: Not on Actuary Strain: Low Risk (09/28/2024)   Overall Financial Resource Strain (CARDIA)    Difficulty of Paying Living Expenses: Not hard at all  Food Insecurity: No Food Insecurity (09/28/2024)   Epic    Worried About Programme Researcher, Broadcasting/film/video in the Last Year: Never true    Ran Out of Food in the Last Year: Never true  Transportation Needs: No Transportation Needs (09/28/2024)   Epic    Lack of Transportation (Medical): No    Lack of Transportation (Non-Medical): No  Physical Activity: Insufficiently Active (09/28/2024)   Exercise Vital Sign    Days of Exercise per Week: 7 days    Minutes of Exercise per Session: 20 min  Stress: No Stress Concern Present (09/28/2024)   [employer] of Occupational Health - Occupational Stress Questionnaire    Feeling of Stress: Not at all  Social Connections: Moderately Integrated (09/28/2024)   Social Connection and Isolation Panel    Frequency of Communication with Friends and Family: More than three times a week    Frequency of Social Gatherings with Friends and Family: Once a  week    Attends Religious Services: 1 to 4 times per year    Active Member of [employer] or Organizations: No    Attends Banker Meetings: Not on file    Marital Status: Married  Intimate Partner Violence: Unknown (09/03/2023)   Received from [facility]   HITS    Physically Hurt: Not on file    Insult or Talk Down To: Not on file    Threaten Physical Harm: Not on file    Scream or Curse: Not on file  Depression (PHQ2-9): Low Risk (12/22/2024)   Depression (PHQ2-9)    PHQ-2 Score: 0  Alcohol Screen: Low Risk (09/28/2024)   Alcohol Screen    Last Alcohol Screening Score (AUDIT): 2  Housing: Low Risk (09/28/2024)   Epic    Unable to Pay for Housing in the Last Year: No  Number of Times Moved in the Last Year: 0    Homeless in the Last Year: No  Utilities: Not on file  Health Literacy: Not on file                                                                                                  Objective:  Physical Exam: BP 122/80   Pulse (!) 58   Temp 97.7 F (36.5 C)   Ht 5' 9 (1.753 m)   Wt 178 lb 6.4 oz (80.9 kg)   SpO2 98%   BMI 26.35 kg/m    Physical Exam GENERAL: Alert, cooperative, well developed, no acute distress. HEENT: Normocephalic, normal oropharynx, moist mucous membranes. CHEST: Clear to auscultation bilaterally, no wheezes, rhonchi, or crackles. CARDIOVASCULAR: Normal heart rate and rhythm, S1 and S2 normal without murmurs. ABDOMEN: Soft, non-tender, non-distended, without organomegaly, normal bowel sounds. EXTREMITIES: Mild erythema and swelling of right second to fourth digits, distal end to first PIP joint, around nails. Mild tenderness, normal range of motion, no drainage in right second to fourth digits. No cyanosis or edema. NEUROLOGICAL: Cranial nerves grossly intact, moves all extremities without gross motor or sensory deficit.   Physical Exam  CT ANGIO CHEST AORTA W/CM & OR WO/CM Result Date: 10/08/2024 EXAM: CTA CHEST AORTA  10/08/2024 10:43:33 AM TECHNIQUE: CTA of the chest was performed without and with the administration of 100 mL of iohexol  (OMNIPAQUE ) 350 MG/ML injection. Multiplanar reformatted images are provided for review. MIP images are provided for review. Automated exposure control, iterative reconstruction, and/or weight based adjustment of the mA/kV was utilized to reduce the radiation dose to as low as reasonably achievable. COMPARISON: 09/25/2023 CLINICAL HISTORY: Dx: Aneurysm of ascending aorta without rupture I71.21 (ICD-10-CM) FINDINGS: AORTA: Grossly stable 4.1 cm ascending thoracic aorta. No thoracic aortic dissection. Great vessels are widely patent. MEDIASTINUM: No mediastinal lymphadenopathy. The heart and pericardium demonstrate no acute abnormality. LYMPH NODES: No mediastinal, hilar or axillary lymphadenopathy. LUNGS AND PLEURA: New 6 x 4 mm nodule is noted in the right lower lobe best seen on image number 83 of series 302. No focal consolidation or pulmonary edema. No pleural effusion or pneumothorax. UPPER ABDOMEN: Limited images of the upper abdomen are unremarkable. SOFT TISSUES AND BONES: No acute bone or soft tissue abnormality. IMPRESSION: 1. Stable 4.1 cm ascending thoracic aorta without dissection. 2. New 6 x 4 mm solid right lower lobe pulmonary nodule; per Fleischner Society Guidelines for a single 68 mm solid nodule with unknown risk, recommend non-contrast chest CT at 612 months; if high risk for malignancy, repeat non-contrast chest CT at 1824 months, and if low risk, consider non-contrast chest CT at 1824 months. Electronically signed by: Lynwood Seip MD 10/08/2024 12:08 PM EDT RP Workstation: [id number]    Recent Results (from the past 2160 hours)  Comp Met (CMET)     Status: Abnormal   Collection Time: 09/30/24 10:19 AM  Result Value Ref Range   Sodium 139 135 - 145 mEq/L   Potassium 4.2 3.5 - 5.1 mEq/L   Chloride 103 96 - 112 mEq/L   CO2  30 19 - 32 mEq/L   Glucose, Bld 104 (H)  70 - 99 mg/dL   BUN 15 6 - 23 mg/dL   Creatinine, Ser 9.01 0.40 - 1.50 mg/dL   Total Bilirubin 0.6 0.2 - 1.2 mg/dL   Alkaline Phosphatase 72 39 - 117 U/L   AST 22 0 - 37 U/L   ALT 25 0 - 53 U/L   Total Protein 7.4 6.0 - 8.3 g/dL   Albumin 4.4 3.5 - 5.2 g/dL   GFR 14.82 >39.99 mL/min    Comment: Calculated using the CKD-EPI Creatinine Equation (2021)   Calcium  9.1 8.4 - 10.5 mg/dL  Lipid panel     Status: Abnormal   Collection Time: 09/30/24 10:19 AM  Result Value Ref Range   Cholesterol 165 0 - 200 mg/dL    Comment: ATP III Classification       Desirable:  < 200 mg/dL               Borderline High:  200 - 239 mg/dL          High:  > = 759 mg/dL   Triglycerides 839.9 (H) 0.0 - 149.0 mg/dL    Comment: Normal:  <849 mg/dLBorderline High:  150 - 199 mg/dL   HDL 53.39 >60.99 mg/dL   VLDL 67.9 0.0 - 59.9 mg/dL   LDL Cholesterol 87 0 - 99 mg/dL   Total CHOL/HDL Ratio 4     Comment:                Men          Women1/2 Average Risk     3.4          3.3Average Risk          5.0          4.42X Average Risk          9.6          7.13X Average Risk          15.0          11.0                       NonHDL 118.68     Comment: NOTE:  Non-HDL goal should be 30 mg/dL higher than patient's LDL goal (i.e. LDL goal of < 70 mg/dL, would have non-HDL goal of < 100 mg/dL)        Beverley Adine Hummer, MD, MS "
